# Patient Record
Sex: Female | Born: 1990 | Race: White | Hispanic: No | Marital: Married | Smoking: Never smoker
Health system: Southern US, Community
[De-identification: ages and names within clinical notes are randomized; demographics above are authoritative.]

## PROBLEM LIST (undated history)

## (undated) ENCOUNTER — Inpatient Hospital Stay (HOSPITAL_COMMUNITY): Payer: 59

## (undated) DIAGNOSIS — K219 Gastro-esophageal reflux disease without esophagitis: Secondary | ICD-10-CM

## (undated) DIAGNOSIS — T8859XA Other complications of anesthesia, initial encounter: Secondary | ICD-10-CM

## (undated) DIAGNOSIS — J45909 Unspecified asthma, uncomplicated: Secondary | ICD-10-CM

## (undated) DIAGNOSIS — I7774 Dissection of vertebral artery: Secondary | ICD-10-CM

## (undated) HISTORY — PX: TONSILLECTOMY: SUR1361

---

## 2016-02-04 ENCOUNTER — Encounter (HOSPITAL_BASED_OUTPATIENT_CLINIC_OR_DEPARTMENT_OTHER): Payer: Self-pay | Admitting: *Deleted

## 2016-02-04 ENCOUNTER — Emergency Department (HOSPITAL_BASED_OUTPATIENT_CLINIC_OR_DEPARTMENT_OTHER): Payer: 59

## 2016-02-04 ENCOUNTER — Observation Stay (HOSPITAL_BASED_OUTPATIENT_CLINIC_OR_DEPARTMENT_OTHER)
Admission: EM | Admit: 2016-02-04 | Discharge: 2016-02-05 | Disposition: A | Discharge status: Home / Self Care | Payer: 59 | Attending: Internal Medicine | Admitting: Internal Medicine

## 2016-02-04 DIAGNOSIS — Z6834 Body mass index (BMI) 34.0-34.9, adult: Secondary | ICD-10-CM | POA: Diagnosis not present

## 2016-02-04 DIAGNOSIS — R42 Dizziness and giddiness: Secondary | ICD-10-CM | POA: Diagnosis not present

## 2016-02-04 DIAGNOSIS — E669 Obesity, unspecified: Secondary | ICD-10-CM | POA: Diagnosis not present

## 2016-02-04 DIAGNOSIS — M542 Cervicalgia: Principal | ICD-10-CM | POA: Insufficient documentation

## 2016-02-04 DIAGNOSIS — I639 Cerebral infarction, unspecified: Secondary | ICD-10-CM

## 2016-02-04 DIAGNOSIS — H539 Unspecified visual disturbance: Secondary | ICD-10-CM

## 2016-02-04 DIAGNOSIS — J45909 Unspecified asthma, uncomplicated: Secondary | ICD-10-CM | POA: Diagnosis not present

## 2016-02-04 DIAGNOSIS — G43109 Migraine with aura, not intractable, without status migrainosus: Secondary | ICD-10-CM | POA: Insufficient documentation

## 2016-02-04 DIAGNOSIS — I7774 Dissection of vertebral artery: Secondary | ICD-10-CM | POA: Diagnosis present

## 2016-02-04 DIAGNOSIS — R51 Headache: Secondary | ICD-10-CM | POA: Diagnosis not present

## 2016-02-04 HISTORY — DX: Unspecified asthma, uncomplicated: J45.909

## 2016-02-04 LAB — PREGNANCY, URINE: Preg Test, Ur: NEGATIVE

## 2016-02-04 IMAGING — CT CT ANGIO NECK
2 of 7 series · 8 of 33 positions shown · IV contrast (APPLIED)
Comparison: None.

CLINICAL DATA: Acute onset LEFT posterior neck pain for 5 hours,
with blurry vision and dizziness.

EXAM:
CT HEAD WITHOUT CONTRAST
CT ANGIOGRAPHY OF THE NECK
TECHNIQUE: Contiguous axial images were obtained from the base of the skull
through the vertex without intravenous contrast. Multidetector CT
imaging of the neck was performed using the standard protocol during
bolus administration of intravenous contrast. Multiplanar CT image
reconstructions and MIPs were obtained to evaluate the vascular
anatomy. Carotid stenosis measurements (when applicable) are
obtained utilizing NASCET criteria, using the distal internal
carotid diameter as the denominator.
CONTRAST:  75mL OMNIPAQUE IOHEXOL 350 MG/ML SOLN

[Series 5: cta neck · axial · 0.37mm/px · z∈[-203,-121]mm · 2 of 125 slices shown]
[im 42/125  soft-tissue]
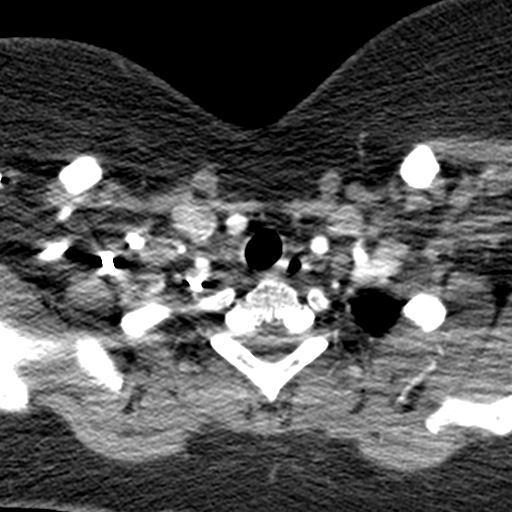
[im 83/125  soft-tissue]
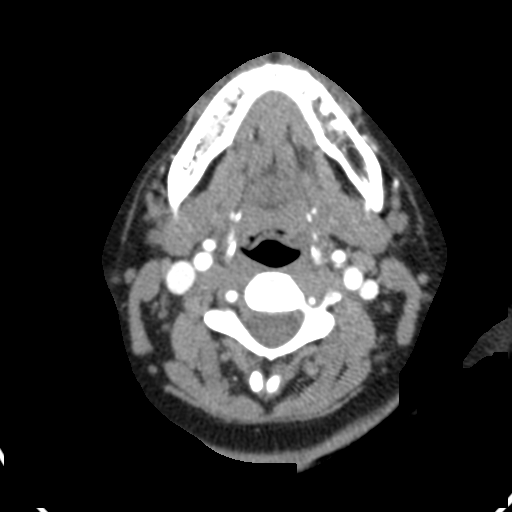

[Series 7: ax thin · axial · 0.39mm/px · z∈[-249,-74]mm · 6 of 247 slices shown]
[im 36/247  soft-tissue]
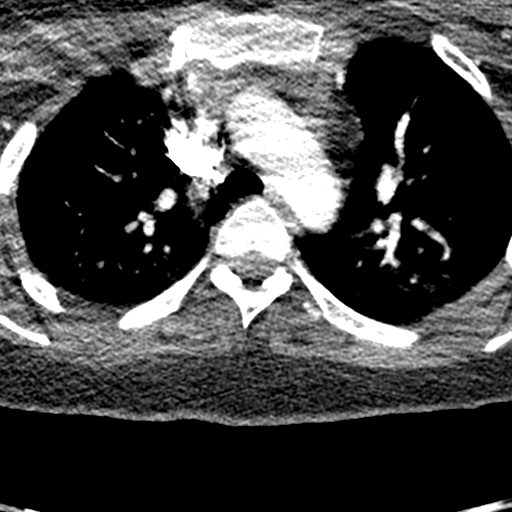
[im 71/247  bone]
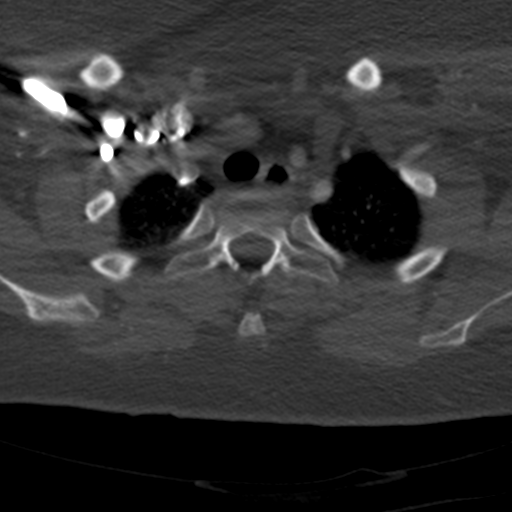
[im 106/247  soft-tissue]
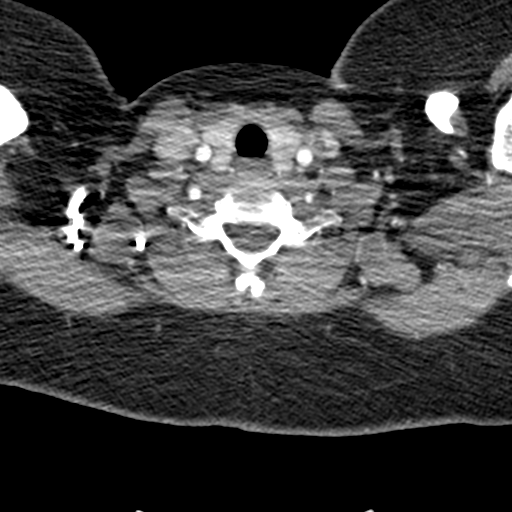
[im 141/247  bone]
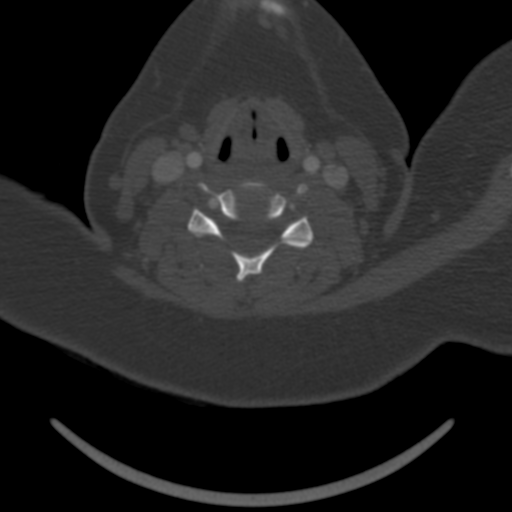
[im 176/247  soft-tissue]
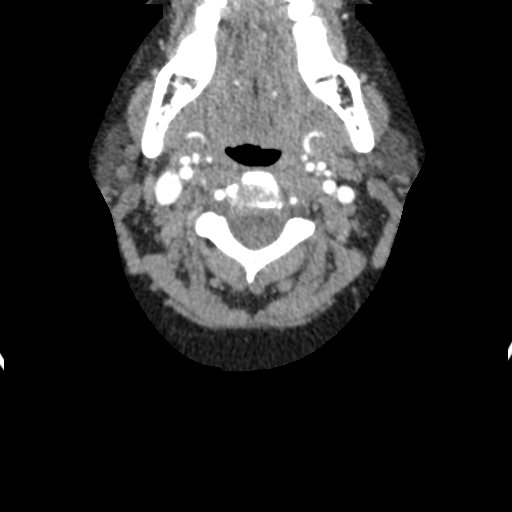
[im 211/247  bone]
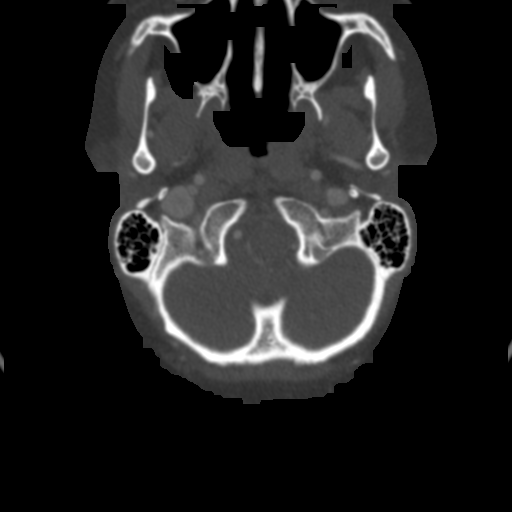

[8 of 33 positions shown; findings below may reference images not displayed]

FINDINGS: CT HEAD WITHOUT CONTRAST

The ventricles and sulci are normal. No intraparenchymal hemorrhage,
mass effect nor midline shift. No acute large vascular territory
infarcts.

No abnormal extra-axial fluid collections. Basal cisterns are
patent.

No skull fracture. The included ocular globes and orbital contents
are non-suspicious. The mastoid aircells and included paranasal
sinuses are well-aerated.

CT ANGIOGRAPHY OF THE NECK

Aortic arch: Normal appearance of the thoracic arch, normal branch
pattern. The origins of the innominate, left Common carotid artery
and subclavian artery are widely patent.

Right carotid system: Common carotid artery is widely patent,
coursing in a straight line fashion. Normal appearance of the
carotid bifurcation without hemodynamically significant stenosis by
NASCET criteria. Normal appearance of the included internal carotid
artery.

Left carotid system: Common carotid artery is widely patent,
coursing in a straight line fashion. Normal appearance of the
carotid bifurcation without hemodynamically significant stenosis by
NASCET criteria. Normal appearance of the included internal carotid
artery.

Vertebral arteries:RIGHT vertebral artery is dominant. Multifocal
contour abnormality of the LEFT V2, V3 and V4 segments with
intermittent midgrade stenosis.

Skeleton: No acute osseous process though bone windows have not been
submitted. Straightened cervical lordosis, normal appearance of the
cervical spine.

Other neck: Soft tissues of the neck are nonacute though, not
tailored for evaluation.
IMPRESSION: CT HEAD:  Normal.

CTA NECK: LEFT vertebral artery contour abnormality compatible with
dissection without dissection flap, no flow limiting stenosis.

Acute findings discussed with and reconfirmed by Dr.CESLOVA HUSIEN

## 2016-02-04 MED ORDER — MORPHINE SULFATE (PF) 4 MG/ML IV SOLN
4.0000 mg | Freq: Once | INTRAVENOUS | Status: AC
  Administered 2016-02-04: 4 mg via INTRAVENOUS
  Filled 2016-02-04: qty 1

## 2016-02-04 NOTE — ED Notes (Signed)
30 minutes ago she had neck pain sudden onset with blurred vision and she felt like she was going to pass out. No blurred vision on arrival but she has pain in her anterior neck.

## 2016-02-04 NOTE — ED Notes (Signed)
States while she was texting her vision became blurred.

## 2016-02-04 NOTE — ED Provider Notes (Signed)
CSN: [id number]     Arrival date & time 02/04/16  7829 History    By signing my name below, I, Arlan Organ, attest that this documentation has been prepared under the direction and in the presence of Doug Sou, MD.  Electronically Signed: Arlan Organ, ED Scribe. 02/04/2016. 10:08 PM.   Chief Complaint  Patient presents with  . Neck Pain   The history is provided by the patient. No language interpreter was used.    HPI Comments: Christine Wilkerson is a 25 y.o. female with a PMHx of asthma who presents to the Emergency Department complaining of gradual onset , ongoing, worsening posterior neck pain that radiates into the frontal area of her head onset 7:00 PM this evening. Discomfort is exacerbated when rotating her neck. No alleviating factors. Pt also reports an episode of blurred vision/"flashing vision" to the L eye while walking around a furniture store earlier. At that time, pt states she felt as though she could pass out. 2 additional episodes reported of blurred vision since time of onset. She is not a smoker. Denies any illicit drug use or alcohol consumption. Pt is unsure of LNMP- She recently had a baby in November of 2016. Currently breast-feeding No treatment prior to coming here  PCP: No PCP Per Patient    History reviewed. No pertinent past medical history. Past Surgical History  Procedure Laterality Date  . Cesarean section     No family history on file. Social History  Substance Use Topics  . Smoking status: Never Smoker   . Smokeless tobacco: None  . Alcohol Use: No   OB History    No data available     Review of Systems  Constitutional: Negative for fever and chills.  Eyes: Positive for visual disturbance.  Respiratory: Negative for cough.   Gastrointestinal: Negative for nausea and vomiting.  Genitourinary:       Amenorrhea  Musculoskeletal: Positive for neck pain.  Neurological: Positive for headaches.  Psychiatric/Behavioral: Negative for confusion.   All other systems reviewed and are negative.     Allergies  Review of patient's allergies indicates no known allergies.  Home Medications   Prior to Admission medications   Not on File   Triage Vitals: BP 115/67 mmHg  Pulse 66  Temp(Src) 97.8 F (36.6 C) (Oral)  Resp 18  Ht 5\' 5"  (1.651 m)  Wt 212 lb (96.163 kg)  BMI 35.28 kg/m2  SpO2 100%   Physical Exam  Constitutional: She is oriented to person, place, and time. She appears well-developed and well-nourished. No distress.  HENT:  Head: Normocephalic and atraumatic.  Eyes: Conjunctivae are normal. Pupils are equal, round, and reactive to light.  Neck: Neck supple. No tracheal deviation present. No thyromegaly present.  Cardiovascular: Normal rate and regular rhythm.   No murmur heard. Pulmonary/Chest: Effort normal and breath sounds normal.  Abdominal: Soft. Bowel sounds are normal. She exhibits no distension. There is no tenderness.  Musculoskeletal: Normal range of motion. She exhibits no edema or tenderness.  Neurological: She is alert and oriented to person, place, and time. No cranial nerve deficit. Coordination normal.  Gait normal Romberg normal pronator drift normal finger to nose normal DTRs symmetric bilaterally at knee jerk and ankle jerk and biceps toes downward going bilaterally  Skin: Skin is warm and dry. No rash noted.  Psychiatric: She has a normal mood and affect.  Nursing note and vitals reviewed.   ED Course  Procedures (including critical care time)  DIAGNOSTIC STUDIES: Oxygen  Saturation is 100% on RA, Normal by my interpretation.    COORDINATION OF CARE: 10:00 PM-Discussed treatment plan with pt at bedside and pt agreed to plan.     Labs Review Labs Reviewed - No data to display  Imaging Review No results found. I have personally reviewed and evaluated these images and lab results as part of my medical decision-making.   EKG Interpretation None     Radiologic findings discussed  with radiologist. 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 I consulted Dr. Amada Jupiter from neurology service who requests aspirin, and 23 hour observation for TIA evaluation undersurface of hospitalists. Results for orders placed or performed during the hospital encounter of 02/04/16  Pregnancy, urine  Result Value Ref Range   Preg Test, Ur NEGATIVE NEGATIVE   Ct Head Wo Contrast  02/05/2016  CLINICAL DATA:  Acute onset LEFT posterior neck pain for 5 hours, with blurry vision and dizziness. EXAM: CT HEAD WITHOUT CONTRAST CT ANGIOGRAPHY OF THE NECK TECHNIQUE: Contiguous axial images were obtained from the base of the skull through the vertex without intravenous contrast. Multidetector CT imaging of the neck was performed using the standard protocol during bolus administration of intravenous contrast. Multiplanar CT image reconstructions and MIPs were obtained to evaluate the vascular anatomy. Carotid stenosis measurements (when applicable) are obtained utilizing NASCET criteria, using the distal internal carotid diameter as the denominator. CONTRAST:  75mL OMNIPAQUE IOHEXOL 350 MG/ML SOLN COMPARISON:  None. FINDINGS: CT HEAD WITHOUT CONTRAST The ventricles and sulci are normal. No intraparenchymal hemorrhage, mass effect nor midline shift. No acute large vascular territory infarcts. No abnormal extra-axial fluid collections. Basal cisterns are patent. No skull fracture. The included ocular globes and orbital contents are non-suspicious. The mastoid aircells and included paranasal sinuses are well-aerated. CT ANGIOGRAPHY OF THE NECK Aortic arch: Normal appearance of the thoracic arch, normal branch pattern. The origins of the innominate, left Common carotid artery and subclavian artery are widely patent. Right carotid system: Common carotid artery is widely patent, coursing in a straight line fashion. Normal appearance of the carotid bifurcation without hemodynamically significant stenosis by NASCET  criteria. Normal appearance of the included internal carotid artery. Left carotid system: Common carotid artery is widely patent, coursing in a straight line fashion. Normal appearance of the carotid bifurcation without hemodynamically significant stenosis by NASCET criteria. Normal appearance of the included internal carotid artery. Vertebral arteries:RIGHT vertebral artery is dominant. Multifocal contour abnormality of the LEFT V2, V3 and V4 segments with intermittent midgrade stenosis. Skeleton: No acute osseous process though bone windows have not been submitted. Straightened cervical lordosis, normal appearance of the cervical spine. Other neck: Soft tissues of the neck are nonacute though, not tailored for evaluation. IMPRESSION: CT HEAD:  Normal. 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 Acute findings discussed with and reconfirmed by Rosalio Loud on 02/05/2016 at 12:40 am. Electronically Signed   By: Awilda Metro M.D.   On: 02/05/2016 00:41   Ct Angio Neck W/cm &/or Wo/cm  02/05/2016  CLINICAL DATA:  Acute onset LEFT posterior neck pain for 5 hours, with blurry vision and dizziness. EXAM: CT HEAD WITHOUT CONTRAST CT ANGIOGRAPHY OF THE NECK TECHNIQUE: Contiguous axial images were obtained from the base of the skull through the vertex without intravenous contrast. Multidetector CT imaging of the neck was performed using the standard protocol during bolus administration of intravenous contrast. Multiplanar CT image reconstructions and MIPs were obtained to evaluate the vascular anatomy. Carotid  stenosis measurements (when applicable) are obtained utilizing NASCET criteria, using the distal internal carotid diameter as the denominator. CONTRAST:  75mL OMNIPAQUE IOHEXOL 350 MG/ML SOLN COMPARISON:  None. FINDINGS: CT HEAD WITHOUT CONTRAST The ventricles and sulci are normal. No intraparenchymal hemorrhage, mass  effect nor midline shift. No acute large vascular territory infarcts. No abnormal extra-axial fluid collections. Basal cisterns are patent. No skull fracture. The included ocular globes and orbital contents are non-suspicious. The mastoid aircells and included paranasal sinuses are well-aerated. CT ANGIOGRAPHY OF THE NECK Aortic arch: Normal appearance of the thoracic arch, normal branch pattern. The origins of the innominate, left Common carotid artery and subclavian artery are widely patent. Right carotid system: Common carotid artery is widely patent, coursing in a straight line fashion. Normal appearance of the carotid bifurcation without hemodynamically significant stenosis by NASCET criteria. Normal appearance of the included internal carotid artery. Left carotid system: Common carotid artery is widely patent, coursing in a straight line fashion. Normal appearance of the carotid bifurcation without hemodynamically significant stenosis by NASCET criteria. Normal appearance of the included internal carotid artery. Vertebral arteries:RIGHT vertebral artery is dominant. Multifocal contour abnormality of the LEFT V2, V3 and V4 segments with intermittent midgrade stenosis. Skeleton: No acute osseous process though bone windows have not been submitted. Straightened cervical lordosis, normal appearance of the cervical spine. Other neck: Soft tissues of the neck are nonacute though, not tailored for evaluation. IMPRESSION: CT HEAD:  Normal. 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 Acute findings discussed with and reconfirmed by Rosalio Loud on 02/05/2016 at 12:40 am. Electronically Signed   By: Awilda Metro M.D.   On: 02/05/2016 00:41    MDM  Moderate clinical suspicion for vertebral artery dissection. Migraine headache also possibility. Case discussed with radiologist suggests CT angiogram of the neck and  noncontrasted head CT Final diagnoses:  None   Dr. Maryfrances Bunnell from hospitalist service was consulted and accepts patient in transfer. Patient passed stroke swallow screen.ASA orderd. Plan 23 hour observation to telemetry  Diagnosis acute vertebral artery dissection        Doug Sou, MD 02/05/16 [id number]

## 2016-02-05 ENCOUNTER — Observation Stay (HOSPITAL_COMMUNITY): Payer: 59

## 2016-02-05 ENCOUNTER — Encounter (HOSPITAL_COMMUNITY): Payer: Self-pay | Admitting: Family Medicine

## 2016-02-05 DIAGNOSIS — I7774 Dissection of vertebral artery: Secondary | ICD-10-CM | POA: Diagnosis present

## 2016-02-05 DIAGNOSIS — G43109 Migraine with aura, not intractable, without status migrainosus: Secondary | ICD-10-CM | POA: Insufficient documentation

## 2016-02-05 LAB — CBC
HEMATOCRIT: 40.7 % (ref 36.0–46.0)
HEMOGLOBIN: 12.9 g/dL (ref 12.0–15.0)
MCH: 27.5 pg (ref 26.0–34.0)
MCHC: 31.7 g/dL (ref 30.0–36.0)
MCV: 86.8 fL (ref 78.0–100.0)
Platelets: 273 10*3/uL (ref 150–400)
RBC: 4.69 MIL/uL (ref 3.87–5.11)
RDW: 13 % (ref 11.5–15.5)
WBC: 9.2 10*3/uL (ref 4.0–10.5)

## 2016-02-05 LAB — CBC WITH DIFFERENTIAL/PLATELET
Basophils Absolute: 0 10*3/uL (ref 0.0–0.1)
Basophils Relative: 0 %
EOS ABS: 0.1 10*3/uL (ref 0.0–0.7)
EOS PCT: 1 %
HCT: 43.1 % (ref 36.0–46.0)
Hemoglobin: 13.9 g/dL (ref 12.0–15.0)
LYMPHS ABS: 3.1 10*3/uL (ref 0.7–4.0)
Lymphocytes Relative: 30 %
MCH: 27.9 pg (ref 26.0–34.0)
MCHC: 32.3 g/dL (ref 30.0–36.0)
MCV: 86.4 fL (ref 78.0–100.0)
MONO ABS: 0.8 10*3/uL (ref 0.1–1.0)
Monocytes Relative: 7 %
Neutro Abs: 6.6 10*3/uL (ref 1.7–7.7)
Neutrophils Relative %: 62 %
PLATELETS: 277 10*3/uL (ref 150–400)
RBC: 4.99 MIL/uL (ref 3.87–5.11)
RDW: 13.4 % (ref 11.5–15.5)
WBC: 10.6 10*3/uL — AB (ref 4.0–10.5)

## 2016-02-05 LAB — BASIC METABOLIC PANEL
Anion gap: 12 (ref 5–15)
BUN: 14 mg/dL (ref 6–20)
CHLORIDE: 105 mmol/L (ref 101–111)
CO2: 25 mmol/L (ref 22–32)
CREATININE: 0.62 mg/dL (ref 0.44–1.00)
Calcium: 9.8 mg/dL (ref 8.9–10.3)
GFR calc Af Amer: >60 mL/min (ref >60)
GLUCOSE: 96 mg/dL (ref 65–99)
Potassium: 4 mmol/L (ref 3.5–5.1)
SODIUM: 142 mmol/L (ref 135–145)

## 2016-02-05 LAB — CREATININE, SERUM: CREATININE: 0.59 mg/dL (ref 0.44–1.00)

## 2016-02-05 IMAGING — MR MR HEAD W/O CM
9 of 12 series · 29 of 48 positions shown · non-contrast
Comparison: CTA neck and noncontrast head CT 02/04/2016.

CLINICAL DATA: 24-year-old female with acute onset left posterior
neck pain, blurred vision and dizziness. Irregularity of the non
dominant left vertebral artery on CTA highly suspicious for acute
dissection. Initial encounter.

EXAM:
MRI HEAD WITHOUT CONTRAST
MRA HEAD WITHOUT CONTRAST
TECHNIQUE: Multiplanar, multiecho pulse sequences of the brain and surrounding
structures were obtained without intravenous contrast. Angiographic
images of the head were obtained using MRA technique without
contrast.

[Series 3: DWI · axial · 3.6mm · 0.94mm/px · z∈[-159,-16]mm · 6 of 86 slices shown (1 of 4)]
[im 1/86]
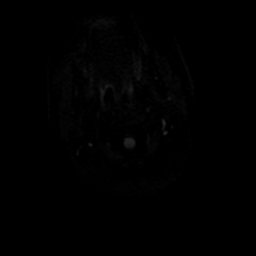
[im 18/86]
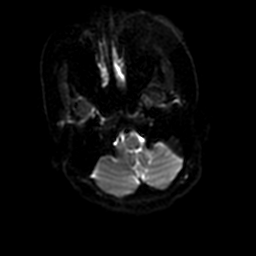
[im 35/86]
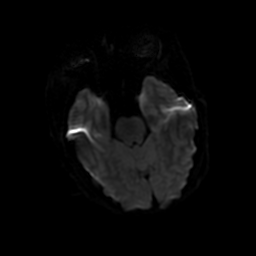
[im 52/86]
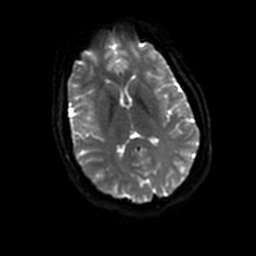
[im 69/86]
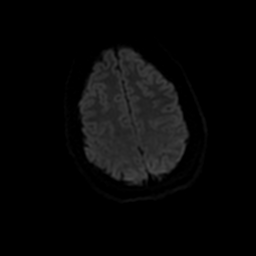
[im 86/86]
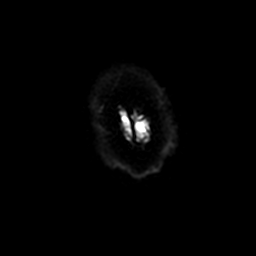

[Series 4: FLAIR · sagittal · 5.0mm · 0.47mm/px · 1 of 21 slices shown (1 of 2)]
[im 1/21]
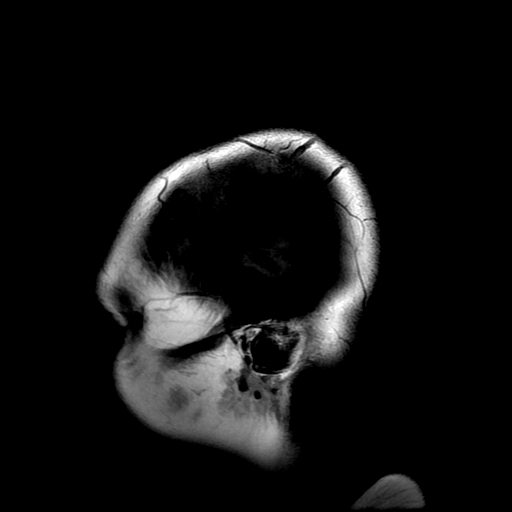

[Series 5: ax (id) 2 · axial · 1.2mm · 0.43mm/px · z∈[-116,-34]mm · 6 of 200 slices shown]
[im 1/200]
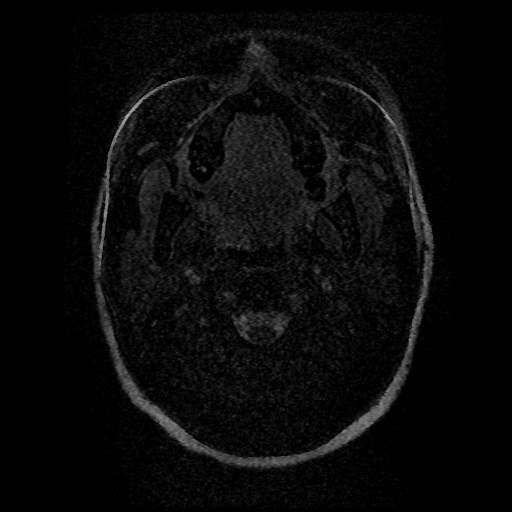
[im 31/200]
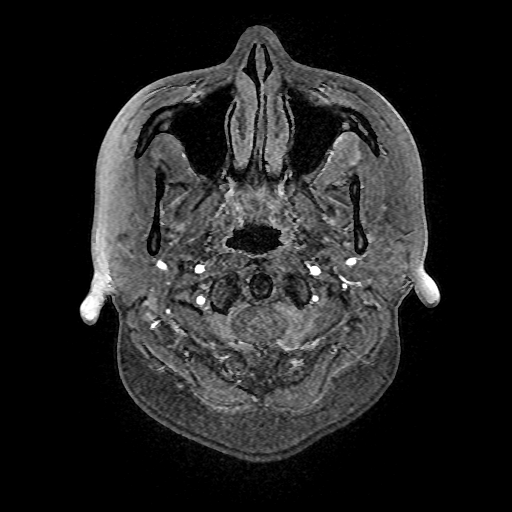
[im 62/200]
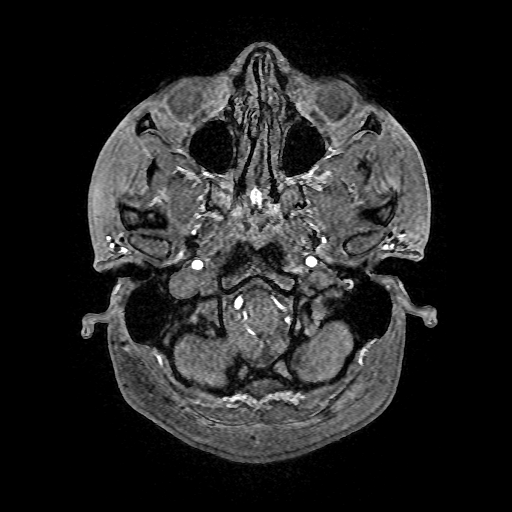
[im 92/200]
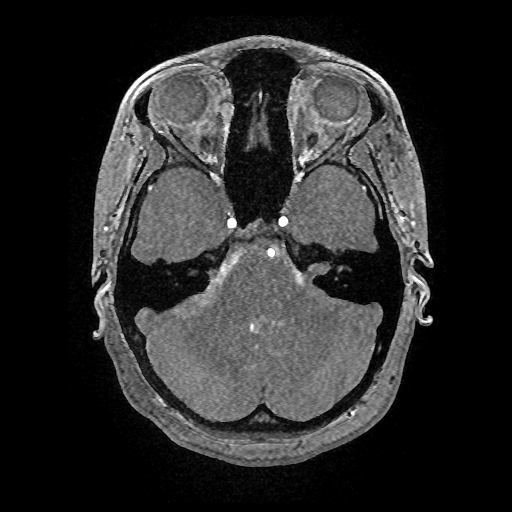
[im 108/200]
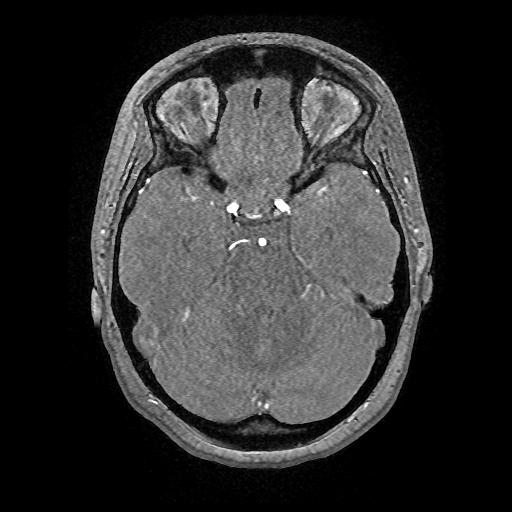
[im 138/200]
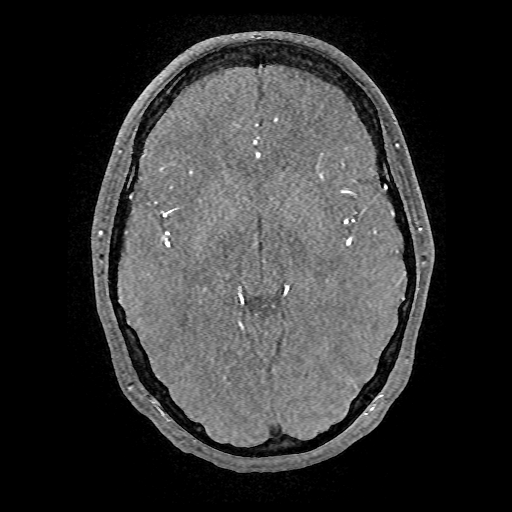

[Series 6: T2 · axial · 5.0mm · 0.47mm/px · z∈[-147,-17]mm · 2 of 24 slices shown (1 of 2)]
[im 1/24]
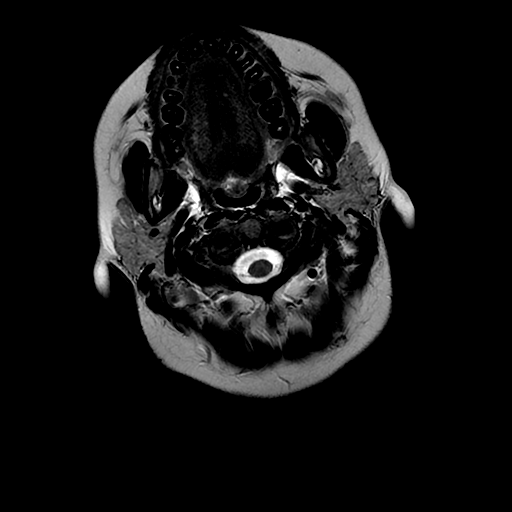
[im 24/24]
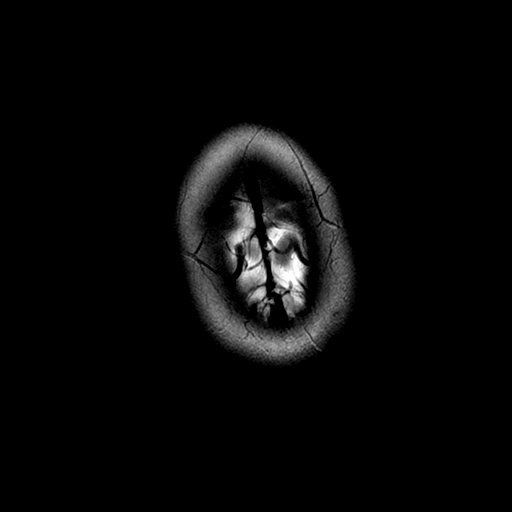

[Series 7: FLAIR · axial · 5.0mm · 0.47mm/px · z∈[-147,-17]mm · 2 of 24 slices shown (2 of 2)]
[im 1/24]
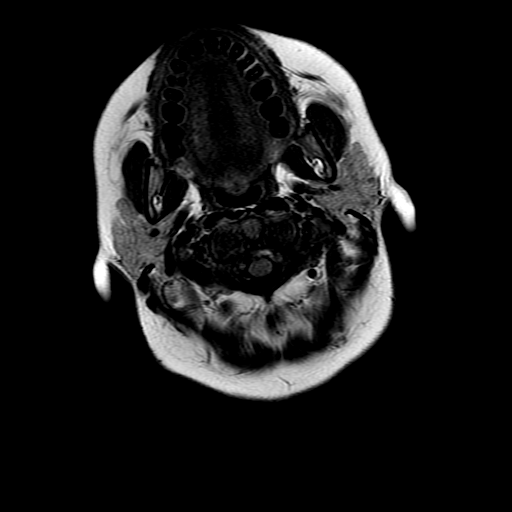
[im 24/24]
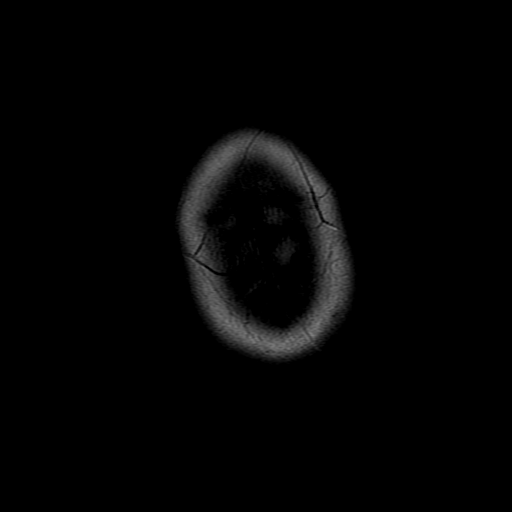

[Series 10: DWI · coronal · 5.0mm · 0.94mm/px · 5 of 72 slices shown (2 of 4)]
[im 1/72]
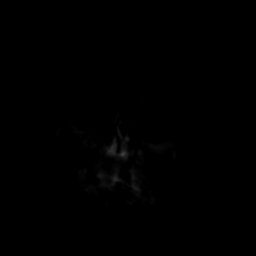
[im 18/72]
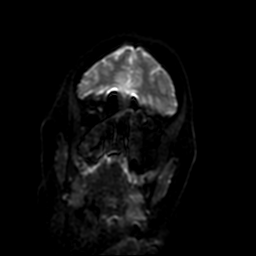
[im 36/72]
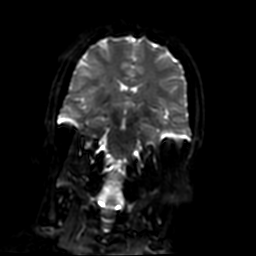
[im 54/72]
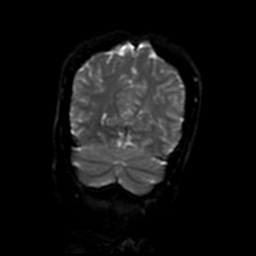
[im 72/72]
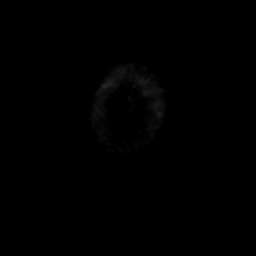

[Series 11: T2 · coronal · 5.0mm · 0.39mm/px · 2 of 30 slices shown (2 of 2)]
[im 1/30]
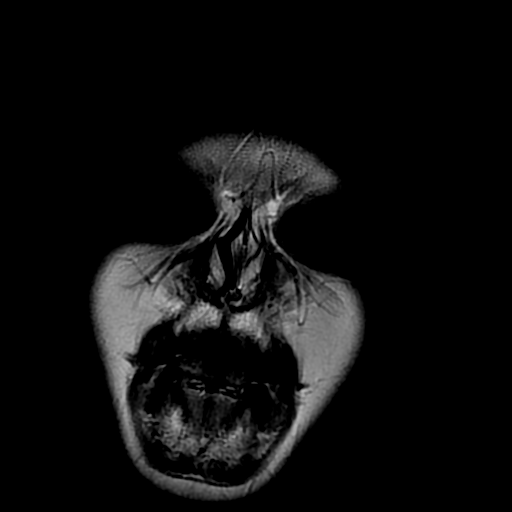
[im 30/30]
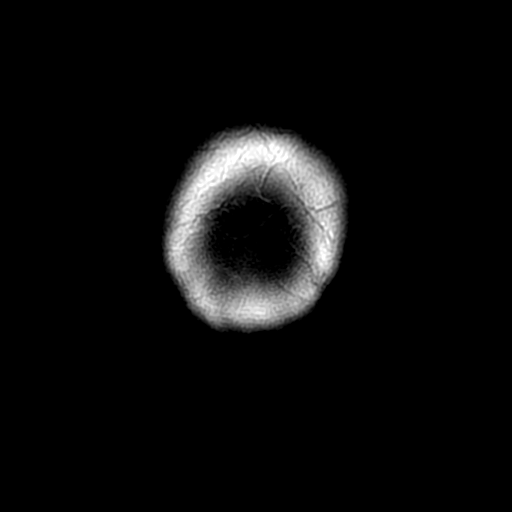

[Series 300: DWI · axial · 3.6mm · 0.94mm/px · z∈[-159,-16]mm · 3 of 43 slices shown (3 of 4)]
[im 1/43]
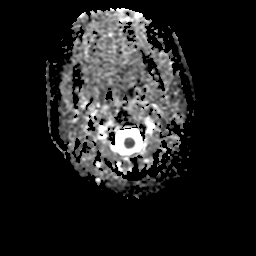
[im 22/43]
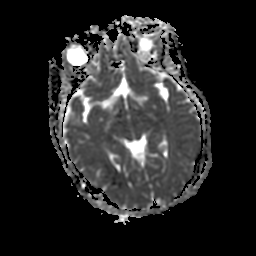
[im 43/43]
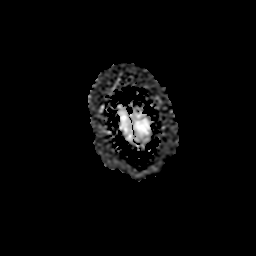

[Series 1000: DWI · coronal · 5.0mm · 0.94mm/px · 2 of 36 slices shown (4 of 4)]
[im 1/36]
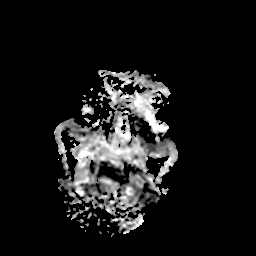
[im 36/36]
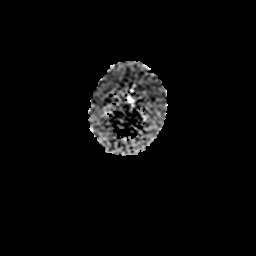

[29 of 48 positions shown; findings below may reference images not displayed]

FINDINGS: MRI HEAD FINDINGS

No restricted diffusion or evidence of acute infarction. No
posterior fossa or posterior circulation restricted diffusion
identified. Major intracranial vascular flow voids are within normal
limits.

Cerebral volume is normal. No midline shift, mass effect, evidence
of mass lesion, ventriculomegaly, extra-axial collection or acute
intracranial hemorrhage. Cervicomedullary junction and pituitary are
within normal limits. Negative visualized cervical spine. Gray and
white matter signal is within normal limits throughout the brain. No
encephalomalacia or chronic cerebral blood products identified.

Visible internal auditory structures appear normal. Mastoids are
clear. Negative nasopharynx. Paranasal sinuses are clear. Negative
orbit and scalp soft tissues. Visualized bone marrow signal is
within normal limits.

MRA HEAD FINDINGS

Antegrade flow in the non dominant visualized left vertebral artery
with no definite pathologic vessel contour abnormality in the V3 or
V4 segments. Dominant left AICA. Negative distal right vertebral
artery. Normal right PICA origin. Patent vertebrobasilar junction.
No basilar artery irregularity. Normal SCA and PCA origins. Right
posterior communicating artery is present, the left is diminutive or
absent. Bilateral PCA branches are within normal limits.

Antegrade flow in both ICA siphons. No siphon stenosis. A 1-2 mm
linear outpouching from the right cavernous ICA could be related to
a small cavernous branch infundibulum or cavernous sinus venous
artifact (series 5, image 101). Otherwise normal siphon contours.
Normal ophthalmic and right posterior communicating artery origins.
Normal carotid termini, MCA and ACA origins.

Anterior communicating artery and visualized bilateral ACA branches
are within normal limits. Visualized bilateral MCA vessels are
within normal limits.
IMPRESSION: 1. No acute infarct identified. Normal noncontrast MRI appearance of
the brain.
2. Negative intracranial MRA. Posterior circulation, including the
intracranial left vertebral artery, appears normal limits. See neck
CTA findings from 7710 hours today regarding abnormal cervical left
vertebral artery.

## 2016-02-05 IMAGING — CT CT HEAD W/O CM
1 series · 14 of 30 positions shown, 18 images · IV contrast (omnipaque)
Comparison: None.

CLINICAL DATA: Acute onset LEFT posterior neck pain for 5 hours,
with blurry vision and dizziness.

EXAM:
CT HEAD WITHOUT CONTRAST
CT ANGIOGRAPHY OF THE NECK
TECHNIQUE: Contiguous axial images were obtained from the base of the skull
through the vertex without intravenous contrast. Multidetector CT
imaging of the neck was performed using the standard protocol during
bolus administration of intravenous contrast. Multiplanar CT image
reconstructions and MIPs were obtained to evaluate the vascular
anatomy. Carotid stenosis measurements (when applicable) are
obtained utilizing NASCET criteria, using the distal internal
carotid diameter as the denominator.
CONTRAST:  75mL OMNIPAQUE IOHEXOL 350 MG/ML SOLN

[Series 2: head wo · axial · 0.47mm/px · z∈[-124,+10]mm · 14 of 30 slices shown, 18 images]
[im 3/30  brain]
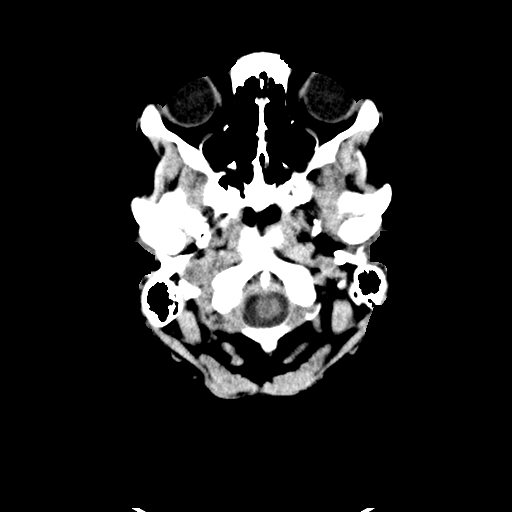
[im 3/30  bone]
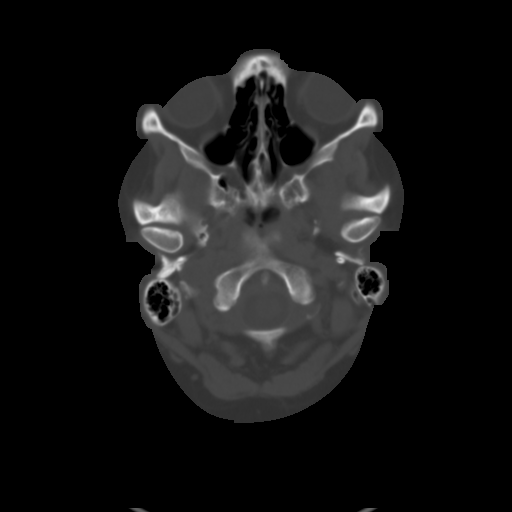
[im 5/30  brain]
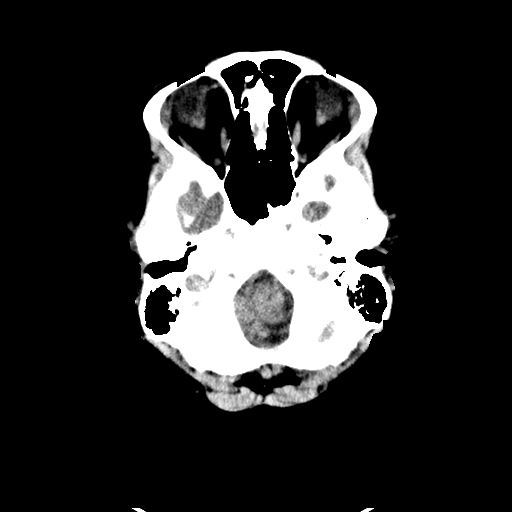
[im 7/30  brain]
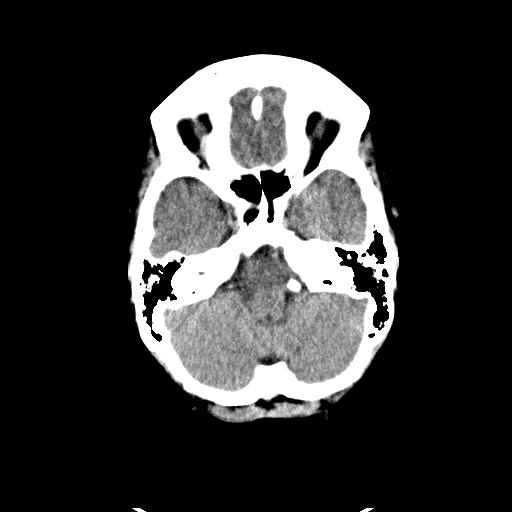
[im 9/30  brain]
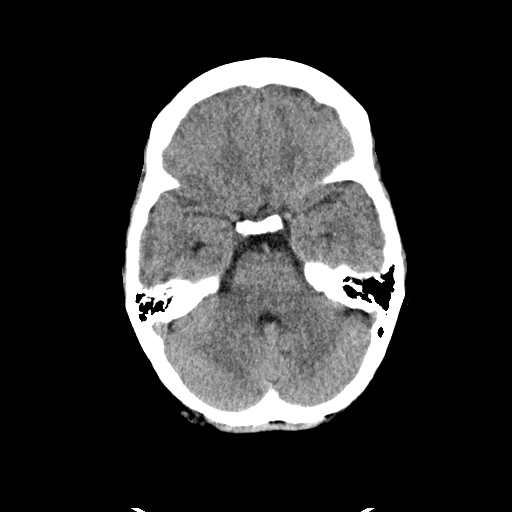
[im 11/30  brain]
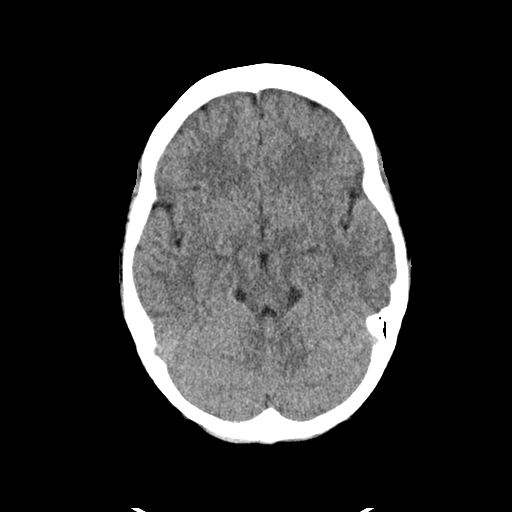
[im 11/30  bone]
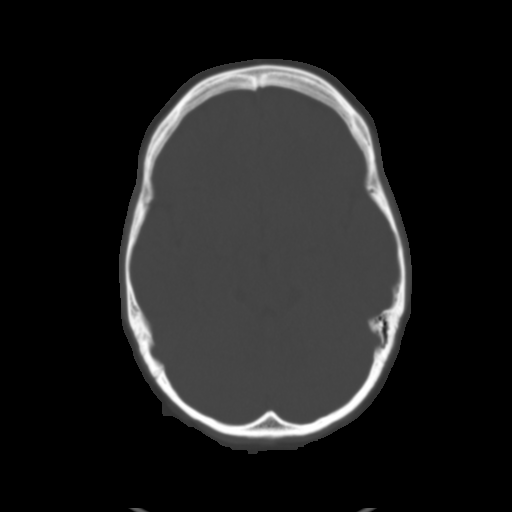
[im 13/30  brain]
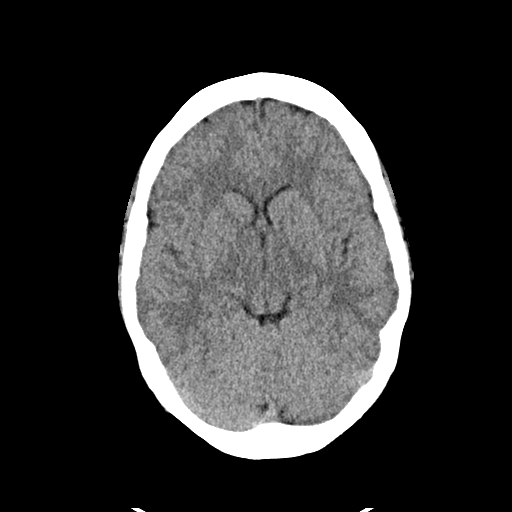
[im 15/30  brain]
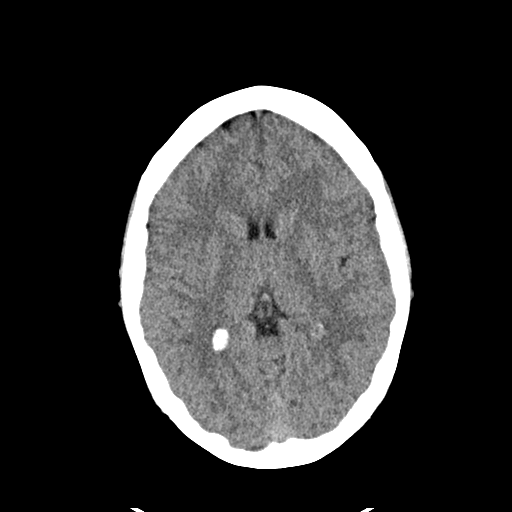
[im 17/30  brain]
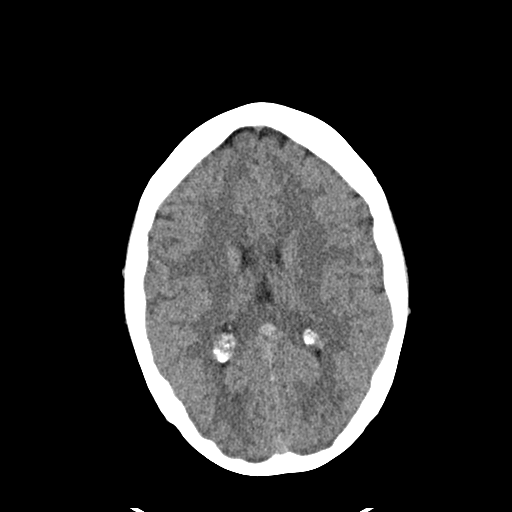
[im 19/30  brain]
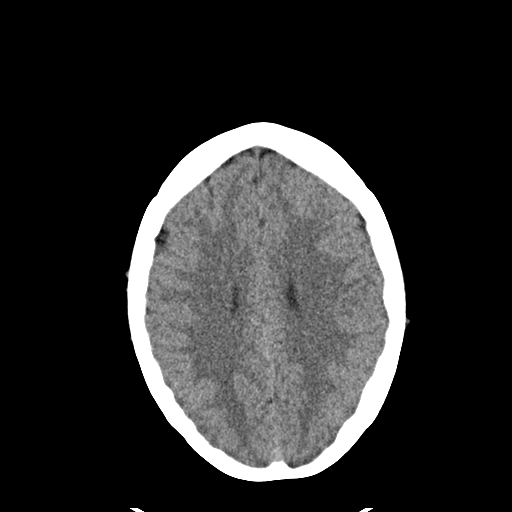
[im 19/30  bone]
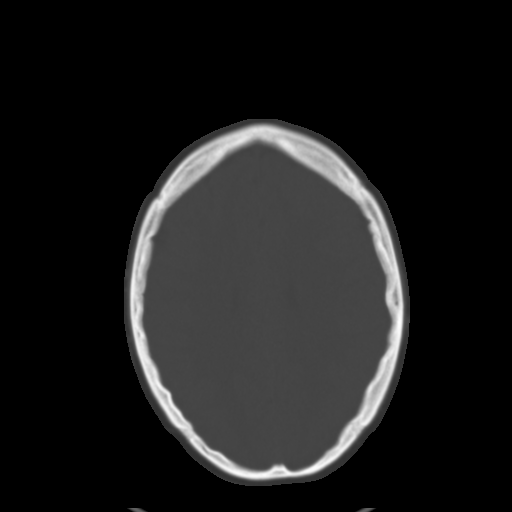
[im 21/30  brain]
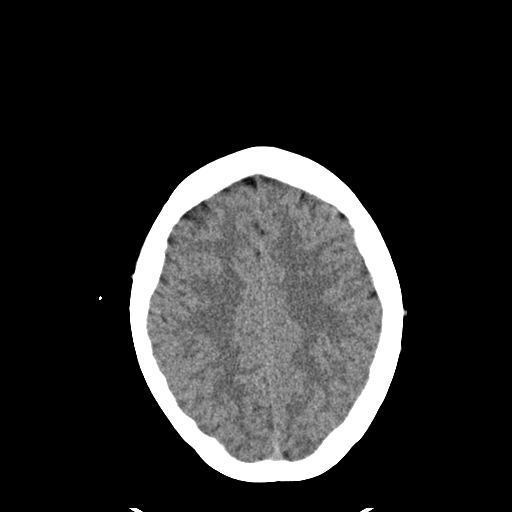
[im 23/30  brain]
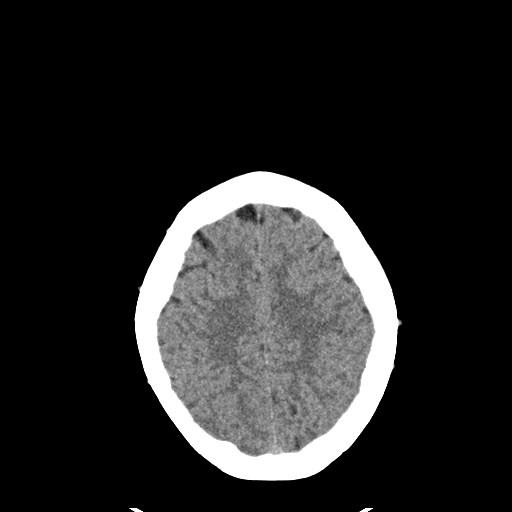
[im 25/30  brain]
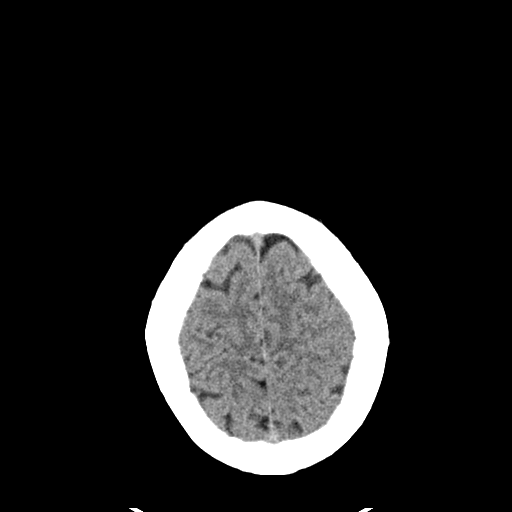
[im 27/30  brain]
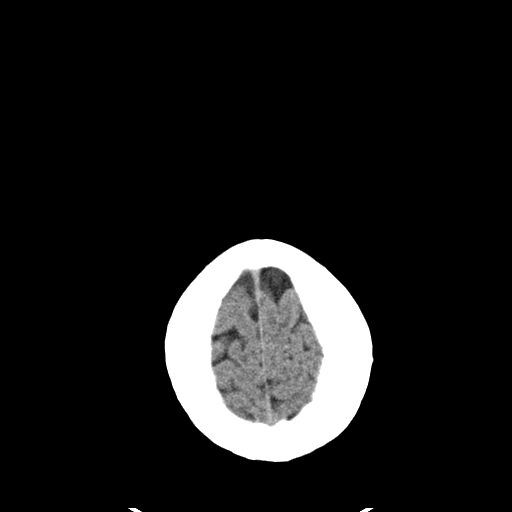
[im 27/30  bone]
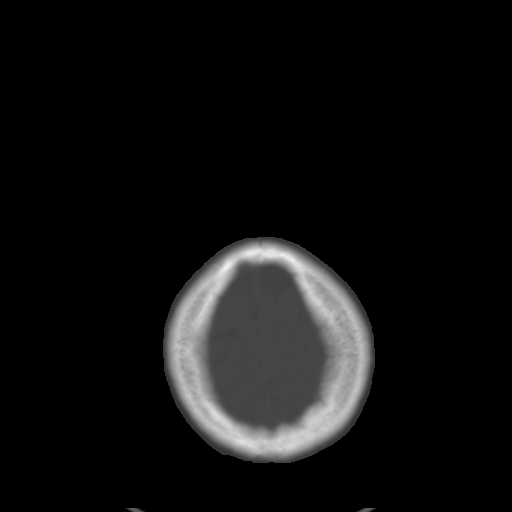
[im 29/30  brain]
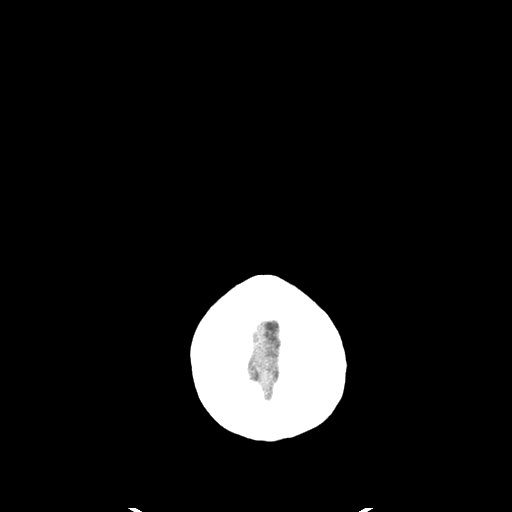

[14 of 30 positions shown; findings below may reference images not displayed]

FINDINGS: CT HEAD WITHOUT CONTRAST

The ventricles and sulci are normal. No intraparenchymal hemorrhage,
mass effect nor midline shift. No acute large vascular territory
infarcts.

No abnormal extra-axial fluid collections. Basal cisterns are
patent.

No skull fracture. The included ocular globes and orbital contents
are non-suspicious. The mastoid aircells and included paranasal
sinuses are well-aerated.

CT ANGIOGRAPHY OF THE NECK

Aortic arch: Normal appearance of the thoracic arch, normal branch
pattern. The origins of the innominate, left Common carotid artery
and subclavian artery are widely patent.

Right carotid system: Common carotid artery is widely patent,
coursing in a straight line fashion. Normal appearance of the
carotid bifurcation without hemodynamically significant stenosis by
NASCET criteria. Normal appearance of the included internal carotid
artery.

Left carotid system: Common carotid artery is widely patent,
coursing in a straight line fashion. Normal appearance of the
carotid bifurcation without hemodynamically significant stenosis by
NASCET criteria. Normal appearance of the included internal carotid
artery.

Vertebral arteries:RIGHT vertebral artery is dominant. Multifocal
contour abnormality of the LEFT V2, V3 and V4 segments with
intermittent midgrade stenosis.

Skeleton: No acute osseous process though bone windows have not been
submitted. Straightened cervical lordosis, normal appearance of the
cervical spine.

Other neck: Soft tissues of the neck are nonacute though, not
tailored for evaluation.
IMPRESSION: CT HEAD:  Normal.

CTA NECK: LEFT vertebral artery contour abnormality compatible with
dissection without dissection flap, no flow limiting stenosis.

Acute findings discussed with and reconfirmed by Dr.CESLOVA HUSIEN

## 2016-02-05 MED ORDER — ASPIRIN 325 MG PO TABS: 325.0000 mg | ORAL_TABLET | Freq: Once | ORAL | Status: DC

## 2016-02-05 MED ORDER — ASPIRIN 325 MG PO TABS: 325.0000 mg | ORAL_TABLET | Freq: Every day | ORAL | Status: DC

## 2016-02-05 MED ORDER — ASPIRIN 325 MG PO TABS
325.0000 mg | ORAL_TABLET | Freq: Every day | ORAL | Status: DC
  Administered 2016-02-05: 325 mg via ORAL
  Filled 2016-02-05: qty 1

## 2016-02-05 MED ORDER — ENOXAPARIN SODIUM 40 MG/0.4ML ~~LOC~~ SOLN
40.0000 mg | SUBCUTANEOUS | Status: DC
  Administered 2016-02-05: 40 mg via SUBCUTANEOUS
  Filled 2016-02-05: qty 0.4

## 2016-02-05 MED ORDER — OXYCODONE HCL 5 MG PO TABS: 5.0000 mg | ORAL_TABLET | Freq: Four times a day (QID) | ORAL | Status: DC | PRN

## 2016-02-05 MED ORDER — MORPHINE SULFATE (PF) 4 MG/ML IV SOLN
4.0000 mg | Freq: Once | INTRAVENOUS | Status: AC
  Administered 2016-02-05: 4 mg via INTRAVENOUS
  Filled 2016-02-05: qty 1

## 2016-02-05 MED ORDER — FENTANYL CITRATE (PF) 100 MCG/2ML IJ SOLN
100.0000 ug | Freq: Once | INTRAMUSCULAR | Status: AC
  Administered 2016-02-05: 100 ug via INTRAVENOUS
  Filled 2016-02-05: qty 2

## 2016-02-05 MED ORDER — IOHEXOL 350 MG/ML SOLN
75.0000 mL | Freq: Once | INTRAVENOUS | Status: AC | PRN
  Administered 2016-02-05: 75 mL via INTRAVENOUS

## 2016-02-05 MED ORDER — ACETAMINOPHEN 325 MG PO TABS: 650.0000 mg | ORAL_TABLET | ORAL | Status: DC | PRN

## 2016-02-05 MED ORDER — ASPIRIN 300 MG RE SUPP: 300.0000 mg | Freq: Every day | RECTAL | Status: DC

## 2016-02-05 MED ORDER — ASPIRIN 325 MG PO TABS
ORAL_TABLET | ORAL | Status: AC
Start: 2016-02-05 — End: 2016-02-05
  Administered 2016-02-05: 325 mg
  Filled 2016-02-05: qty 1

## 2016-02-05 MED ORDER — ACETAMINOPHEN 650 MG RE SUPP: 650.0000 mg | RECTAL | Status: DC | PRN

## 2016-02-05 NOTE — Progress Notes (Signed)
STROKE TEAM PROGRESS NOTE   HISTORY OF PRESENT ILLNESS Christine Wilkerson is a 25 y.o. female who was in her normal state of health until shortly before 7 PM tonight (LKW 645p 02/04/2016). At that point, she had sudden onset left neck pain which has been persistent. About 15 minutes later, she noticed visual change in her left eye which she describes as starting in the left lower quadrant then progressing to include most of the left side. Lasted for approximately 15 minutes. This is happened to subsequent times. There also flashes of light associated with these changes.She has had headache as well, this started later than the neck pain. She states that she did hit her head this morning, he is not sure if she jerked her neck or any other abnormal movements of her neck when she hit it. No other recent trauma, nausea, coughing. Premorbid modified rankin scale: 0. Patient was not administered TPA secondary to mild symptoms. She was admitted for further evaluation and treatment.   SUBJECTIVE (INTERVAL HISTORY) Her husband is at the bedside.  Overall she feels her condition is resolved. She recounted her HPI with Dr. Roda Shutters - reported circumferential blurring along the edge of her vision with flashing in the left eye only. Right eye no vision changes. Lasted 15 minutes. Recurred twice after getting to the ED. She then developed a HA. Denied hx of migraines. HA went away after getting medication. She had very infrequent HAs previously, maybe once every 2 months. She describes wavy lines in her visual field yesterday. Hit front of head hard yesterday on window seal.   OBJECTIVE Temp:  [97.4 F (36.3 C)-98.2 F (36.8 C)] 98.2 F (36.8 C) (02/15 0332) Pulse Rate:  [66-82] 78 (02/15 0332) Cardiac Rhythm:  [-] Normal sinus rhythm (02/15 0700) Resp:  [16-18] 18 (02/15 0332) BP: (100-129)/(61-76) 103/61 mmHg (02/15 0332) SpO2:  [97 %-100 %] 98 % (02/15 0332) Weight:  [94.983 kg (209 lb 6.4 oz)-96.163 kg (212 lb)] 94.983  kg (209 lb 6.4 oz) (02/15 0332)  CBC:   Recent Labs Lab 02/05/16 0120 02/05/16 0548  WBC 10.6* 9.2  NEUTROABS 6.6  --   HGB 13.9 12.9  HCT 43.1 40.7  MCV 86.4 86.8  PLT 277 273    Basic Metabolic Panel:   Recent Labs Lab 02/05/16 0120 02/05/16 0548  NA 142  --   K 4.0  --   CL 105  --   CO2 25  --   GLUCOSE 96  --   BUN 14  --   CREATININE 0.62 0.59  CALCIUM 9.8  --     Lipid Panel: No results found for: CHOL, TRIG, HDL, CHOLHDL, VLDL, LDLCALC HgbA1c: No results found for: HGBA1C Urine Drug Screen: No results found for: LABOPIA, COCAINSCRNUR, LABBENZ, AMPHETMU, THCU, LABBARB    IMAGING I have personally reviewed the radiological images below and agree with the radiology interpretations.  CT HEAD 02/05/2016  :  Normal.   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   Mr Brain Wo Contrast 02/05/2016  1. No acute infarct identified. Normal noncontrast MRI appearance of the brain.   Mr Maxine Glenn Head Wo Contrast 02/05/2016   2. Negative intracranial MRA. Posterior circulation, including the intracranial left vertebral artery, appears normal limits.    Physical exam  Temp:  [97.4 F (36.3 C)-98.2 F (36.8 C)] 97.7 F (36.5 C) (02/15 1200) Pulse Rate:  [61-82] 61 (02/15 1200)  Resp:  [16-19] 19 (02/15 1200) BP: (91-129)/(55-76) 91/55 mmHg (02/15 1200) SpO2:  [97 %-100 %] 99 % (02/15 1200) Weight:  [209 lb 6.4 oz (94.983 kg)-212 lb (96.163 kg)] 209 lb 6.4 oz (94.983 kg) (02/15 0332)  General - Well nourished, well developed, in no apparent distress.  Ophthalmologic - Sharp disc margins OU.   Cardiovascular - Regular rate and rhythm with no murmur.  Mental Status -  Level of arousal and orientation to time, place, and person were intact. Language including expression, naming, repetition, comprehension was assessed and found intact. Fund of Knowledge was assessed  and was intact.  Cranial Nerves II - XII - II - Visual field intact OU. III, IV, VI - Extraocular movements intact. V - Facial sensation intact bilaterally. VII - Facial movement intact bilaterally. VIII - Hearing & vestibular intact bilaterally. X - Palate elevates symmetrically. XI - Chin turning & shoulder shrug intact bilaterally. XII - Tongue protrusion intact.  Motor Strength - The patient's strength was normal in all extremities and pronator drift was absent.  Bulk was normal and fasciculations were absent.   Motor Tone - Muscle tone was assessed at the neck and appendages and was normal.  Reflexes - The patient's reflexes were 1+ in all extremities and she had no pathological reflexes.  Sensory - Light touch, temperature/pinprick, vibration and proprioception were assessed and were symmetrical.    Coordination - The patient had normal movements in the hands and feet with no ataxia or dysmetria.  Tremor was absent.  Gait and Station - deferred due to safety concerns.   ASSESSMENT/PLAN Christine Wilkerson is a 25 y.o. female with no significant medical history presenting with sudden onset neck pain with temporary visual change in her L eye. She did not receive IV t-PA due to mild symptoms.   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 Continue at discharge.  Patient counseled to be compliant with her antithrombotic medications  Ongoing aggressive stroke risk factor management  Therapy recommendations:  No therapy needs  Disposition:   Return home (lives with husband and 2 children in [place])  Questionable migraine with aura  Visual changes only happened in that eye  With flashing, maybe lines and scotoma  Headache preceded by visual changes  No clear history of migraine but there is a history of headache  Headache resolved  Other Stroke Risk Factors  Obesity, Body mass index is 34.85 kg/(m^2).   Patient lactating, aspirin okay - double check with pharmacy, this dosage is okay for lactating but recommend to avoid lactating within 3 hours of aspirin. I called patient cell phone and instructed her to avoid lactation within 3 hours of taking aspirin. She expressed understanding.   Hospital day # 1  Neurology will sign off. Please call with questions. Pt will follow up with Dr. Roda Shutters at [facility] in about 2 months. Thanks for the consult.  Marvel Plan, MD PhD Stroke Neurology 02/05/2016 6:12 PM     To contact Stroke Continuity provider, please refer to [email or web address]. After hours, contact General Neurology'

## 2016-02-05 NOTE — Discharge Summary (Signed)
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 female with no significant past medical history who presents with neck pain.  The patient was in her usual state of health until this evening when she had abrupt onset of left neck pain while she was walking around the store. The pain was severe in intensity and aching in character, and was soon associated with visual changes, flashing lights, and blurriness in her left eye as well as left-sided frontal headache. She felt dizzy but had no speech changes, no focal weakness, no numbness, no syncope or seizure, no vomiting, no fever, but presented immediately to the ER. There has been no recent significant head trauma.  In the ED, she was afebrile and normotensive. WBC 10.6, hemoglobin 13.9, Na 142, K4.0, creatinine 0.6. There was concern for vertebral artery dissection, and so CT angiogram of the neck was obtained which showed luminal irregularities in the left vertebral artery consistent with dissection.  The case was discussed with neurology, who recommended MRI and observation, and TRH were asked to admit.  Hospital Course:  Possible vertebral artery dissection;  neurological deficit has resolved.  Neurology recommends aspirin daily.  Follow up with neurology in 3 months.  I advised patient not to lactate if she is taking oxycodone, and or aspirin.    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  But due to MRI finding Vertebral artery dissection is possibility.  Headache and neuro deficit has resolved.   Procedures:  none  Consultations:  Neurology   Discharge Exam: Filed Vitals:   02/05/16 0332 02/05/16 1200  BP: 103/61 91/55  Pulse: 78 61  Temp: 98.2 F (36.8 C) 97.7 F (36.5 C)  Resp: 18 19    General: NAD Cardiovascular: S 1, S 2 RRR Respiratory: CTA  Discharge Instructions   Discharge Instructions    Diet - low sodium heart healthy    Complete by:  As directed      Increase activity slowly    Complete by:  As directed           Current Discharge Medication List    START taking these medications   Details  aspirin 325 MG tablet Take 1 tablet (325 mg total) by mouth daily. Qty: 30 tablet, Refills: 0    oxyCODONE (OXY IR/ROXICODONE) 5 MG immediate release tablet Take 1 tablet (5 mg total) by mouth every 6 (six) hours as needed for severe pain. Qty: 30 tablet, Refills: 0      CONTINUE these medications which have NOT CHANGED   Details  albuterol (PROVENTIL HFA;VENTOLIN HFA) 108 (90 Base) MCG/ACT inhaler Inhale 2 puffs into the lungs as needed. wheezing    Prenatal MV-Min-Fe Fum-FA-DHA (PRENATAL 1 PO) Take 1 tablet by mouth daily.       No Known Allergies Follow-up Information    Follow up with Xu,Jindong, MD In 4 weeks.   Specialty:  Neurology   Contact information:   [address] [place] [address] [phone]        The results of significant diagnostics from this hospitalization (including imaging, microbiology, ancillary and laboratory) are listed below for reference.    Significant Diagnostic Studies: Ct Head Wo Contrast  02/05/2016  CLINICAL DATA:  Acute onset LEFT posterior neck pain for 5 hours, with blurry vision and dizziness. EXAM: CT HEAD WITHOUT CONTRAST CT ANGIOGRAPHY OF THE NECK TECHNIQUE: Contiguous axial images were  obtained from the base of the skull through the vertex without intravenous contrast. Multidetector CT imaging of the neck was performed using the standard protocol during bolus administration of intravenous contrast. Multiplanar CT image reconstructions and MIPs were obtained to evaluate the vascular anatomy. Carotid stenosis measurements (when applicable) are obtained utilizing NASCET criteria, using the distal internal carotid diameter as the denominator. CONTRAST:  75mL OMNIPAQUE IOHEXOL 350 MG/ML SOLN COMPARISON:  None. FINDINGS: CT HEAD WITHOUT CONTRAST The ventricles and sulci are normal. No intraparenchymal hemorrhage, mass effect nor midline shift. No acute large vascular territory infarcts. No abnormal extra-axial fluid collections. Basal cisterns are patent. No skull fracture. The included ocular globes and orbital contents are non-suspicious. The mastoid aircells and included paranasal sinuses are well-aerated. CT ANGIOGRAPHY OF THE NECK Aortic arch: Normal appearance of the thoracic arch, normal branch pattern. The origins of the innominate, left Common carotid artery and subclavian artery are widely patent. Right carotid system: Common carotid artery is widely patent, coursing in a straight line fashion. Normal appearance of the carotid bifurcation without hemodynamically significant stenosis by NASCET criteria. Normal appearance of the included internal carotid artery. Left carotid system: Common carotid artery is widely patent, coursing in a straight line fashion. Normal appearance of the carotid bifurcation without hemodynamically significant stenosis by NASCET criteria. Normal appearance of the included internal carotid artery. Vertebral arteries:RIGHT vertebral artery is dominant. Multifocal contour abnormality of the LEFT V2, V3 and V4 segments with intermittent midgrade stenosis. Skeleton: No acute osseous process though bone windows have not been submitted. Straightened cervical lordosis, normal  appearance of the cervical spine. Other neck: Soft tissues of the neck are nonacute though, not tailored for evaluation. IMPRESSION: CT HEAD:  Normal. 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 Acute findings discussed with and reconfirmed by Rosalio Loud on 02/05/2016 at 12:40 am. Electronically Signed   By: Awilda Metro M.D.   On: 02/05/2016 00:41   Ct Angio Neck W/cm &/or Wo/cm  02/05/2016  CLINICAL DATA:  Acute onset LEFT posterior neck pain for 5 hours, with blurry vision and dizziness. EXAM: CT HEAD WITHOUT CONTRAST CT ANGIOGRAPHY OF THE NECK TECHNIQUE: Contiguous axial images were obtained from the base of the skull through the vertex without intravenous contrast. Multidetector CT imaging of the neck was performed using the standard protocol during bolus administration of intravenous contrast. Multiplanar CT image reconstructions and MIPs were obtained to evaluate the vascular anatomy. Carotid stenosis measurements (when applicable) are obtained utilizing NASCET criteria, using the distal internal carotid diameter as the denominator. CONTRAST:  75mL OMNIPAQUE IOHEXOL 350 MG/ML SOLN COMPARISON:  None. FINDINGS: CT HEAD WITHOUT CONTRAST The ventricles and sulci are normal. No intraparenchymal hemorrhage, mass effect nor midline shift. No acute large vascular territory infarcts. No abnormal extra-axial fluid collections. Basal cisterns are patent. No skull fracture. The included ocular globes and orbital contents are non-suspicious. The mastoid aircells and included paranasal sinuses are well-aerated. CT  ANGIOGRAPHY OF THE NECK Aortic arch: Normal appearance of the thoracic arch, normal branch pattern. The origins of the innominate, left Common carotid artery and subclavian artery are widely patent. Right carotid system: Common carotid artery is widely patent, coursing in a straight line fashion. Normal appearance  of the carotid bifurcation without hemodynamically significant stenosis by NASCET criteria. Normal appearance of the included internal carotid artery. Left carotid system: Common carotid artery is widely patent, coursing in a straight line fashion. Normal appearance of the carotid bifurcation without hemodynamically significant stenosis by NASCET criteria. Normal appearance of the included internal carotid artery. Vertebral arteries:RIGHT vertebral artery is dominant. Multifocal contour abnormality of the LEFT V2, V3 and V4 segments with intermittent midgrade stenosis. Skeleton: No acute osseous process though bone windows have not been submitted. Straightened cervical lordosis, normal appearance of the cervical spine. Other neck: Soft tissues of the neck are nonacute though, not tailored for evaluation. IMPRESSION: CT HEAD:  Normal. 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 Acute findings discussed with and reconfirmed by Rosalio Loud on 02/05/2016 at 12:40 am. Electronically Signed   By: Awilda Metro M.D.   On: 02/05/2016 00:41   Mr Shirlee Latch Wo Contrast  02/05/2016  CLINICAL DATA:  25 year old female with acute onset left posterior neck pain, blurred vision and dizziness. Irregularity of the non dominant left vertebral artery on CTA highly suspicious for acute dissection. Initial encounter. EXAM: MRI HEAD WITHOUT CONTRAST MRA HEAD WITHOUT CONTRAST TECHNIQUE: Multiplanar, multiecho pulse sequences of the brain and surrounding structures were obtained without intravenous contrast. Angiographic images of the head were obtained using MRA technique without contrast. COMPARISON:  CTA neck and noncontrast head CT 02/04/2016. FINDINGS: MRI HEAD FINDINGS No restricted diffusion or evidence of acute infarction. No posterior fossa or posterior circulation restricted diffusion identified. Major intracranial vascular flow voids  are within normal limits. Cerebral volume is normal. No midline shift, mass effect, evidence of mass lesion, ventriculomegaly, extra-axial collection or acute intracranial hemorrhage. Cervicomedullary junction and pituitary are within normal limits. Negative visualized cervical spine. Wallace Cullens and white matter signal is within normal limits throughout the brain. No encephalomalacia or chronic cerebral blood products identified. Visible internal auditory structures appear normal. Mastoids are clear. Negative nasopharynx. Paranasal sinuses are clear. Negative orbit and scalp soft tissues. Visualized bone marrow signal is within normal limits. MRA HEAD FINDINGS Antegrade flow in the non dominant visualized left vertebral artery with no definite pathologic vessel contour abnormality in the V3 or V4 segments. Dominant left AICA. Negative distal right vertebral artery. Normal right PICA origin. Patent vertebrobasilar junction. No basilar artery irregularity. Normal SCA and PCA origins. Right posterior communicating artery is present, the left is diminutive or absent. Bilateral PCA branches are within normal limits. Antegrade flow in both ICA siphons. No siphon stenosis. A 1-2 mm linear outpouching from the right cavernous ICA could be related to a small cavernous branch infundibulum or cavernous sinus venous artifact (series 5, image 101). Otherwise normal siphon contours. Normal ophthalmic and right posterior communicating artery origins. Normal carotid termini, MCA and ACA origins. Anterior communicating artery and visualized bilateral ACA branches are within normal limits. Visualized bilateral MCA vessels are within normal limits. IMPRESSION: 1. No acute infarct identified. Normal noncontrast MRI appearance of the brain. 2. Negative intracranial MRA. Posterior circulation, including the intracranial left vertebral artery, appears normal limits. See neck CTA findings from 0041 hours today regarding abnormal cervical left  vertebral artery. Electronically Signed  By: Odessa Fleming M.D.   On: 02/05/2016 10:30   Mr Brain Wo Contrast  02/05/2016  CLINICAL DATA:  25 year old female with acute onset left posterior neck pain, blurred vision and dizziness. Irregularity of the non dominant left vertebral artery on CTA highly suspicious for acute dissection. Initial encounter. EXAM: MRI HEAD WITHOUT CONTRAST MRA HEAD WITHOUT CONTRAST TECHNIQUE: Multiplanar, multiecho pulse sequences of the brain and surrounding structures were obtained without intravenous contrast. Angiographic images of the head were obtained using MRA technique without contrast. COMPARISON:  CTA neck and noncontrast head CT 02/04/2016. FINDINGS: MRI HEAD FINDINGS No restricted diffusion or evidence of acute infarction. No posterior fossa or posterior circulation restricted diffusion identified. Major intracranial vascular flow voids are within normal limits. Cerebral volume is normal. No midline shift, mass effect, evidence of mass lesion, ventriculomegaly, extra-axial collection or acute intracranial hemorrhage. Cervicomedullary junction and pituitary are within normal limits. Negative visualized cervical spine. Wallace Cullens and white matter signal is within normal limits throughout the brain. No encephalomalacia or chronic cerebral blood products identified. Visible internal auditory structures appear normal. Mastoids are clear. Negative nasopharynx. Paranasal sinuses are clear. Negative orbit and scalp soft tissues. Visualized bone marrow signal is within normal limits. MRA HEAD FINDINGS Antegrade flow in the non dominant visualized left vertebral artery with no definite pathologic vessel contour abnormality in the V3 or V4 segments. Dominant left AICA. Negative distal right vertebral artery. Normal right PICA origin. Patent vertebrobasilar junction. No basilar artery irregularity. Normal SCA and PCA origins. Right posterior communicating artery is present, the left is diminutive  or absent. Bilateral PCA branches are within normal limits. Antegrade flow in both ICA siphons. No siphon stenosis. A 1-2 mm linear outpouching from the right cavernous ICA could be related to a small cavernous branch infundibulum or cavernous sinus venous artifact (series 5, image 101). Otherwise normal siphon contours. Normal ophthalmic and right posterior communicating artery origins. Normal carotid termini, MCA and ACA origins. Anterior communicating artery and visualized bilateral ACA branches are within normal limits. Visualized bilateral MCA vessels are within normal limits. IMPRESSION: 1. No acute infarct identified. Normal noncontrast MRI appearance of the brain. 2. Negative intracranial MRA. Posterior circulation, including the intracranial left vertebral artery, appears normal limits. See neck CTA findings from 0041 hours today regarding abnormal cervical left vertebral artery. Electronically Signed   By: Odessa Fleming M.D.   On: 02/05/2016 10:30    Microbiology: No results found for this or any previous visit (from the past 240 hour(s)).   Labs: Basic Metabolic Panel:  Recent Labs Lab 02/05/16 0120 02/05/16 0548  NA 142  --   K 4.0  --   CL 105  --   CO2 25  --   GLUCOSE 96  --   BUN 14  --   CREATININE 0.62 0.59  CALCIUM 9.8  --    Liver Function Tests: No results for input(s): AST, ALT, ALKPHOS, BILITOT, PROT, ALBUMIN in the last 168 hours. No results for input(s): LIPASE, AMYLASE in the last 168 hours. No results for input(s): AMMONIA in the last 168 hours. CBC:  Recent Labs Lab 02/05/16 0120 02/05/16 0548  WBC 10.6* 9.2  NEUTROABS 6.6  --   HGB 13.9 12.9  HCT 43.1 40.7  MCV 86.4 86.8  PLT 277 273   Cardiac Enzymes: No results for input(s): CKTOTAL, CKMB, CKMBINDEX, TROPONINI in the last 168 hours. BNP: BNP (last 3 results) No results for input(s): BNP in the last 8760 hours.  ProBNP (last  3 results) No results for input(s): PROBNP in the last 8760  hours.  CBG: No results for input(s): GLUCAP in the last 168 hours.     Signed:  Alba Cory MD.  [facility] 02/05/2016, 1:38 PM

## 2016-02-05 NOTE — Discharge Instructions (Signed)
Do not breastfeed while taking aspirin and or oxycodone. Follow up with PCP

## 2016-02-05 NOTE — H&P (Signed)
History and Physical  Patient Name: Christine Wilkerson     OZH:[id number]    DOB: June 09, 1991    DOA: 02/04/2016 Referring physician: Doug Sou, MD PCP: No PCP Per Patient      Chief Complaint: Neck pain  HPI: Christine Wilkerson is a 25 y.o. female with no significant past medical history who presents with neck pain.  The patient was in her usual state of health until this evening when she had abrupt onset of left neck pain while she was walking around the store. The pain was severe in intensity and aching in character, and was soon associated with visual changes, flashing lights, and blurriness in her left eye as well as left-sided frontal headache. She felt dizzy but had no speech changes, no focal weakness, no numbness, no syncope or seizure, no vomiting, no fever, but presented immediately to the ER. There has been no recent significant head trauma.  In the ED, she was afebrile and normotensive. WBC 10.6, hemoglobin 13.9, Na 142, K4.0, creatinine 0.6.  There was concern for vertebral artery dissection, and so CT angiogram of the neck was obtained which showed luminal irregularities in the left vertebral artery consistent with dissection.  The case was discussed with neurology, who recommended MRI and observation, and [facility] were asked to admit.       Review of Systems:  Pt complains of neck pain, headache, visual changes. All other systems negative except as just noted or noted in the history of present illness.  No Known Allergies  Prior to Admission medications   Albuterol, when necessary     Past Medical History  Diagnosis Date  . Asthma     Past Surgical History  Procedure Laterality Date  . Cesarean section    . Cesarean section      Family history: family history includes Bell's palsy in her mother; Diabetes in her father; Neuropathy in her sister; Rheum arthritis in her mother. no family history of vascular disease or stroke  Social History: Patient lives with her husband  and 2 children in [place]. She works at home. She is not a smoker. She is independent with all ADLs and IADLs.       Physical Exam: BP 103/61 mmHg  Pulse 78  Temp(Src) 98.2 F (36.8 C) (Oral)  Resp 18  Ht  (1.651 m)  Wt 94.983 kg (209 lb 6.4 oz)  BMI 34.85 kg/m2  SpO2 98% General appearance: Well-developed, adult female, alert and in no acute distress.   Eyes: Anicteric, conjunctiva pink, lids and lashes normal.     ENT: No nasal deformity, discharge, or epistaxis.  OP moist without lesions.   Lymph: No cervical or supraclavicular lymphadenopathy. Skin: Warm and dry.  No jaundice.  No suspicious rashes or lesions. Cardiac: RRR, nl S1-S2, no murmurs appreciated.  Capillary refill is brisk.  JVP normal.  No LE edema.  Radial and DP pulses 2+ and symmetric. Respiratory: Normal respiratory rate and rhythm.  CTAB without rales or wheezes. Abdomen: Abdomen soft without rigidity.  No TTP. No ascites, distension.   MSK: No deformities or effusions. Neuro: Visual fields normal to confrontation.  Pupils are 3 mm and reactive to 2 mm.  Extraocular movements are intact, without nystagmus.  Cranial nerve 5 is within normal limits.  Cranial nerve 7 is symmetrical.  Cranial nerve 8 is within normal limits.  Cranial nerves 9 and 10 reveal equal palate elevation.  Cranial nerve 11 reveals sternocleidomastoid strong.  Cranial nerve 12 is midline.  Motor strength testing is 5/5 in the upper and lower extremities bilaterally with normal motor, tone and bulk.  Sensory examination is intact to light touch and position.  Romberg maneuver is negative for pathology.  Finger-to-nose testing is within normal limits. The patient is oriented to time, place and person.  Speech is fluent.  Naming is grossly intact.  Recall, recent and remote, as well as general fund of knowledge seem within normal limits.  Attention span and concentration are within normal limits. Psych: Behavior appropriate.  Affect normal.  No  evidence of aural or visual hallucinations or delusions.       Labs on Admission:  The metabolic panel shows normal sodium, potassium, bicarbonate, and renal function. The complete blood count shows minimal elevation in WBC, no anemia or thrombocytopenia. Pregnancy test negative.   Radiological Exams on Admission: Ct Head Wo Contrast and Ct Angio Neck W/cm &/or Wo/cm 02/05/2016  Vertebral arteries:RIGHT vertebral artery is dominant. Multifocal contour abnormality of the LEFT V2, V3 and V4 segments with intermittent midgrade stenosis.  IMPRESSION: CT HEAD:  Normal. 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       Assessment/Plan 1. Vertebral artery dissection:    -Aspirin 325 mg  -MRI brain now -Oxycodone or acetaminophen for pain, PRN -Consult to Neurology, appreciate cares       DVT PPx: Lovenox Diet: Regular Consultants: Neurology Code Status: Full Family Communication: Husband, present at bedside  Medical decision making: What exists of the patient's previous chart was reviewed in depth and the case was discussed with Dr. Ethelda Chick. Patient seen 5:23 AM on 02/05/2016.  Disposition Plan:  I recommend admission to telemetry.  Clinical condition: stable.  Anticipate MRI today, if normal, home after, otherwise, stroke work up per Neurology.      Alberteen Sam [facility] Pager [phone]

## 2016-02-05 NOTE — Progress Notes (Signed)
25 yo F previously healthy, had acute onset neck pain tonight, associated with transient (~2-4 hours) vision loss in the L eye and frontal headache.  There was concern in the ER for dissection, and a CT angiogram of the neck appears to show a vertebral artery dissection.  This was discussed with Neurology, who recommend aspirin, MRI tonight and TIA workup.  They will see.  To tele, obs status.

## 2016-02-05 NOTE — Care Management Note (Signed)
Case Management Note Donn Pierini RN, BSN Unit 2W-Case Manager [phone]  Patient Details  Name: Christine Wilkerson MRN: [id number] Date of Birth: 04-26-1991  Subjective/Objective:    Pt admitted with vertebral artery dissection                Action/Plan: PTA pt lived at home- anticipate return home- referral received for PCP needs- spoke with pt at bedside who states she is looking for PCP- [facility] # given to pt with instructions for use- also advised pt she could contact [facility] # on back of insurance card for list of providers- pt to f/u on finding PCP in area of choice.   Expected Discharge Date:    02/05/16              Expected Discharge Plan:  Home/Self Care  In-House Referral:     Discharge planning Services  CM Consult  Post Acute Care Choice:    Choice offered to:     DME Arranged:    DME Agency:     HH Arranged:    HH Agency:     Status of Service:  Completed, signed off  Medicare Important Message Given:    Date Medicare IM Given:    Medicare IM give by:    Date Additional Medicare IM Given:    Additional Medicare Important Message give by:     If discussed at Long Length of Stay Meetings, dates discussed:    Discharge Disposition: home/self care   Additional Comments:  Darrold Span, RN 02/05/2016, 2:50 PM

## 2016-02-05 NOTE — Consult Note (Signed)
Neurology Consultation Reason for Consult: Concern for dissection Referring Physician: Alden Server  CC: Visual change  History is obtained from: Patient  HPI: Christine Wilkerson is a 25 y.o. female who was in her normal state of health until shortly before 7 PM tonight. At that point, she had sudden onset left neck pain which has been persistent. About 15 minutes later, she noticed visual change in her left eye which she describes as starting in the left lower quadrant then progressing to include most of the left side. Lasted for approximately 15 minutes. This is happened to subsequent times. There also flashes of light associated with these changes.  She has had headache as well, this started later than the neck pain. She states that she did hit her head this morning, he is not sure if she jerked her neck or any other abnormal movements of her neck when she hit it. No other recent trauma, nausea, coughing.  LKW: 6:45 PM tpa given?: no, mild symptoms Premorbid modified rankin scale: 0   ROS: A 14 point ROS was performed and is negative except as noted in the HPI.   History reviewed. No pertinent past medical history.  family history: No history of  similar  Social History:  reports that she has never smoked. She does not have any smokeless tobacco history on file. She reports that she does not drink alcohol or use illicit drugs.   Exam: Current vital signs: BP 103/61 mmHg  Pulse 78  Temp(Src) 98.2 F (36.8 C) (Oral)  Resp 18  Ht  (1.651 m)  Wt 94.983 kg (209 lb 6.4 oz)  BMI 34.85 kg/m2  SpO2 98% Vital signs in last 24 hours: Temp:  [97.4 F (36.3 C)-98.2 F (36.8 C)] 98.2 F (36.8 C) (02/15 0332) Pulse Rate:  [66-82] 78 (02/15 0332) Resp:  [16-18] 18 (02/15 0332) BP: (100-129)/(61-76) 103/61 mmHg (02/15 0332) SpO2:  [97 %-100 %] 98 % (02/15 0332) Weight:  [94.983 kg (209 lb 6.4 oz)-96.163 kg (212 lb)] 94.983 kg (209 lb 6.4 oz) (02/15 0332)   Physical Exam   Constitutional: Appears well-developed and well-nourished.  Psych: Affect appropriate to situation Eyes: No scleral injection HENT: No OP obstrucion Head: Normocephalic.  Cardiovascular: Normal rate and regular rhythm.  Respiratory: Effort normal and breath sounds normal to anterior ascultation GI: Soft.  No distension. There is no tenderness.  Skin: WDI  Neuro: Mental Status: Patient is awake, alert, oriented to person, place, month, year, and situation. Patient is able to give a clear and coherent history. No signs of aphasia or neglect Cranial Nerves: II: Visual Fields are full. Pupils are equal, round, and reactive to light.   III,IV, VI: EOMI without ptosis or diploplia.  V: Facial sensation is symmetric to temperature VII: Facial movement is symmetric.  VIII: hearing is intact to voice X: Uvula elevates symmetrically XI: Shoulder shrug is symmetric. XII: tongue is midline without atrophy or fasciculations.  Motor: Tone is normal. Bulk is normal. 5/5 strength was present in all four extremities.  Sensory: Sensation is symmetric to light touch and temperature in the arms and legs. Deep Tendon Reflexes: 2+ and symmetric in the biceps and patellae.  Plantars: Toes are downgoing bilaterally.  Cerebellar: FNF and HKS are intact bilaterally  I have reviewed labs in epic and the results pertinent to this consultation are: BMP-unremarkable  I have reviewed the images obtained: CTA-irregularity in the left vertebral  Impression: 25 year old female with sudden onset neck pain and visual change with  findings on CT angiogram concerning for possible vertebral dissection. She does have pain in the location that would correspond to this and therefore I do think that this likely represents dissection. The visual changes, however, sound more migrainous in nature then ischemic given the progressive involvement of the field as well as flashing lights. I do think that she needs an MRI to  fully assess whether she has had actual areas of infarct were not.  If the MRI is negative, I do not think that further workup at this time would be likely to assist with lowering her stroke risk and therefore would not pursue any further workup.  Recommendations: 1) MRI brain 2) ASA 325 mg daily 3) if MRI does not demonstrate ischemic stroke, then no further workup at this time.  Ritta Slot, MD [facility] [phone]  If 7pm- 7am, please page neurology on call as listed in AMION.

## 2016-03-04 ENCOUNTER — Encounter: Payer: Self-pay | Admitting: Neurology

## 2016-03-04 ENCOUNTER — Ambulatory Visit (INDEPENDENT_AMBULATORY_CARE_PROVIDER_SITE_OTHER): Payer: 59 | Admitting: Neurology

## 2016-03-04 VITALS — BP 105/69 | HR 68 | Ht 65.0 in | Wt 216.0 lb

## 2016-03-04 DIAGNOSIS — I7774 Dissection of vertebral artery: Secondary | ICD-10-CM

## 2016-03-04 DIAGNOSIS — G43109 Migraine with aura, not intractable, without status migrainosus: Secondary | ICD-10-CM

## 2016-03-04 MED ORDER — FOLIC ACID 1 MG PO TABS: 1.0000 mg | ORAL_TABLET | Freq: Every day | ORAL | Status: DC

## 2016-03-04 MED ORDER — TOPIRAMATE 25 MG PO TABS: ORAL_TABLET | ORAL | Status: DC

## 2016-03-04 NOTE — Progress Notes (Signed)
Reason for visit: Vertebral artery dissection  [facility]  [name] is a 25 y.o. female  History of present illness:  Christine Wilkerson is a 25 year old left-handed white female with a history of an admission to the hospital on 02/05/2016. The patient had onset of a black line in the vision involving the left eye only. Over several minutes, this evolved into blurring of vision in the left inferior quadrant and eventually the patient had alteration in vision throughout the entire periphery, with only central vision left. The patient is fairly certain that only the left eye was involved. The patient indicated that this visual change occurred about 5 minutes after she had turned her head to the right, and then developed sudden onset of pain in the left neck area. The patient had some photophobia and phonophobia with the headache, some nausea and some dizziness. She denied any numbness or weakness of the extremities. She had no syncope. She has had daily headaches since that time that have been migratory around the head, sometimes behind one eye or the other, sometimes on the top of the head or in the temporal areas. The patient was admitted to the hospital on February 15, she underwent a MRI of the brain that was unremarkable, CT angiogram revealed what appeared to be a dissection of the left vertebral artery without vascular stenosis. The patient was placed on low-dose aspirin, and she remains on this medication. She has not had any significant change in her clinical condition. She comes to the office today for an evaluation.  Past Medical History  Diagnosis Date  . Asthma     Past Surgical History  Procedure Laterality Date  . Cesarean section    . Cesarean section      Family History  Problem Relation Age of Onset  . Rheum arthritis Mother   . Bell's palsy Mother   . Osteoporosis Mother   . Diabetes Father   . Neuropathy Sister     CRPS  . Asthma Sister   .  Diabetes Brother   . Migraines Neg Hx   . Crohn's disease Brother   . Asthma Sister   . Asthma Sister   . Asthma Sister     Social history:  reports that she has never smoked. She does not have any smokeless tobacco history on file. She reports that she does not drink alcohol or use illicit drugs.  Medications:  Prior to Admission medications   Medication Sig Start Date End Date Taking? Authorizing Provider  albuterol (PROVENTIL HFA;VENTOLIN HFA) 108 (90 Base) MCG/ACT inhaler Inhale 2 puffs into the lungs as needed. wheezing   Yes Historical Provider, MD  aspirin 325 MG tablet Take 1 tablet (325 mg total) by mouth daily. 02/05/16  Yes Belkys A Regalado, MD  oxyCODONE (OXY IR/ROXICODONE) 5 MG immediate release tablet Take 1 tablet (5 mg total) by mouth every 6 (six) hours as needed for severe pain. 02/05/16  Yes Belkys A Regalado, MD  Prenatal MV-Min-Fe Fum-FA-DHA (PRENATAL 1 PO) Take 1 tablet by mouth daily.   Yes Historical Provider, MD      Allergies  Allergen Reactions  . Other     eggwhites    ROS:  Out of a complete 14 system review of symptoms, the patient complains only of the following symptoms, and all other reviewed systems are negative.  Fatigue Dizziness Blurred vision Easy bruising Headache  Blood pressure 105/69, pulse 68, height 5\' 5"  (1.651 m), weight 216 lb (  97.977 kg).  Physical Exam  General: The patient is alert and cooperative at the time of the examination. The patient is markedly obese.  Eyes: Pupils are equal, round, and reactive to light. Discs are flat bilaterally.  Neck: The neck is supple, no carotid bruits are noted.  Respiratory: The respiratory examination is clear.  Cardiovascular: The cardiovascular examination reveals a regular rate and rhythm, no obvious murmurs or rubs are noted.  Skin: Extremities are without significant edema.  Neurologic Exam  Mental status: The patient is alert and oriented x 3 at the time of the examination.  The patient has apparent normal recent and remote memory, with an apparently normal attention span and concentration ability.  Cranial nerves: Facial symmetry is present. There is good sensation of the face to pinprick and soft touch bilaterally. The strength of the facial muscles and the muscles to head turning and shoulder shrug are normal bilaterally. Speech is well enunciated, no aphasia or dysarthria is noted. Extraocular movements are full. Visual fields are full. The tongue is midline, and the patient has symmetric elevation of the soft palate. No obvious hearing deficits are noted.  Motor: The motor testing reveals 5 over 5 strength of all 4 extremities. Good symmetric motor tone is noted throughout.  Sensory: Sensory testing is intact to pinprick, soft touch, vibration sensation, and position sense on all 4 extremities. No evidence of extinction is noted.  Coordination: Cerebellar testing reveals good finger-nose-finger and heel-to-shin bilaterally.  Gait and station: Gait is normal. Tandem gait is normal. Romberg is negative. No drift is seen.  Reflexes: Deep tendon reflexes are symmetric and normal bilaterally. Toes are downgoing bilaterally.   MRI head 02/05/16:  IMPRESSION: 1. No acute infarct identified. Normal noncontrast MRI appearance of the brain. 2. Negative intracranial MRA. Posterior circulation, including the intracranial left vertebral artery, appears normal limits. See neck CTA findings from 0041 hours today regarding abnormal cervical left vertebral artery.  * MRI scan images were reviewed online. I agree with the written report.   CTA head and neck 02/05/16:  IMPRESSION: CT HEAD: Normal.  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    Assessment/Plan:  1. Left vertebral artery dissection  2. Monocular visual disturbance, left eye  3. Headache  The clinical  history given by the patient is most consistent with a migraine headache. The patient has had some evidence of a vertebral artery dissection by CT angiogram, but I do not believe that all of her symptoms are related to this. The patient will be placed on Topamax, and she will follow-up in 4 months. We will consider a repeat CT angiogram of the neck at that time. She is remain on aspirin, she was given a prescription for folic acid 1 mg daily. She does not have a primary care physician.  Marlan Palau MD 03/04/2016 7:13 PM  [facility] [address] [place], [place] [address]  Phone [phone] Fax [phone]

## 2016-03-04 NOTE — Patient Instructions (Signed)
 Topamax (topiramate) is a seizure medication that has an FDA approval for seizures and for migraine headache. Potential side effects of this medication include weight loss, cognitive slowing, tingling in the fingers and toes, and carbonated drinks will taste bad. If any significant side effects are noted on this drug, please contact our office.  Migraine Headache A migraine headache is an intense, throbbing pain on one or both sides of your head. A migraine can last for 30 minutes to several hours. CAUSES  The exact cause of a migraine headache is not always known. However, a migraine may be caused when nerves in the brain become irritated and release chemicals that cause inflammation. This causes pain. Certain things may also trigger migraines, such as:  Alcohol.  Smoking.  Stress.  Menstruation.  Aged cheeses.  Foods or drinks that contain nitrates, glutamate, aspartame, or tyramine.  Lack of sleep.  Chocolate.  Caffeine.  Hunger.  Physical exertion.  Fatigue.  Medicines used to treat chest pain (nitroglycerine), birth control pills, estrogen, and some blood pressure medicines. SIGNS AND SYMPTOMS  Pain on one or both sides of your head.  Pulsating or throbbing pain.  Severe pain that prevents daily activities.  Pain that is aggravated by any physical activity.  Nausea, vomiting, or both.  Dizziness.  Pain with exposure to bright lights, loud noises, or activity.  General sensitivity to bright lights, loud noises, or smells. Before you get a migraine, you may get warning signs that a migraine is coming (aura). An aura may include:  Seeing flashing lights.  Seeing bright spots, halos, or zigzag lines.  Having tunnel vision or blurred vision.  Having feelings of numbness or tingling.  Having trouble talking.  Having muscle weakness. DIAGNOSIS  A migraine headache is often diagnosed based on:  Symptoms.  Physical exam.  A CT scan or MRI of your  head. These imaging tests cannot diagnose migraines, but they can help rule out other causes of headaches. TREATMENT Medicines may be given for pain and nausea. Medicines can also be given to help prevent recurrent migraines.  HOME CARE INSTRUCTIONS  Only take over-the-counter or prescription medicines for pain or discomfort as directed by your health care provider. The use of long-term narcotics is not recommended.  Lie down in a dark, quiet room when you have a migraine.  Keep a journal to find out what may trigger your migraine headaches. For example, write down:  What you eat and drink.  How much sleep you get.  Any change to your diet or medicines.  Limit alcohol consumption.  Quit smoking if you smoke.  Get 7-9 hours of sleep, or as recommended by your health care provider.  Limit stress.  Keep lights dim if bright lights bother you and make your migraines worse. SEEK IMMEDIATE MEDICAL CARE IF:   Your migraine becomes severe.  You have a fever.  You have a stiff neck.  You have vision loss.  You have muscular weakness or loss of muscle control.  You start losing your balance or have trouble walking.  You feel faint or pass out.  You have severe symptoms that are different from your first symptoms. MAKE SURE YOU:   Understand these instructions.  Will watch your condition.  Will get help right away if you are not doing well or get worse.   This information is not intended to replace advice given to you by your health care provider. Make sure you discuss any questions you have with your   health care provider.   Document Released: 12/07/2005 Document Revised: 12/28/2014 Document Reviewed: 08/14/2013 Elsevier Interactive Patient Education 2016 Elsevier Inc.  

## 2016-03-10 ENCOUNTER — Telehealth: Payer: Self-pay | Admitting: Neurology

## 2016-03-10 NOTE — Telephone Encounter (Signed)
I called the patient. She is having some blurred vision on the Topamax. This is intermittent and is not severe. She  Is to continue the medication for now. She is just on a 25 mg dose so far. She is still having daily headache.

## 2016-03-11 NOTE — Telephone Encounter (Signed)
Please see other phone note from this date.

## 2016-05-07 ENCOUNTER — Telehealth: Payer: Self-pay | Admitting: Neurology

## 2016-05-07 NOTE — Telephone Encounter (Signed)
Spoke to pt. Says that she has tapered up to Topamax 3 tabs per day and plans on decreasing to 2 tabs daily for a week and then 1 tab daily for a week before stopping medication. Will call and let Dr. Anne HahnWillis know if she feels that she needs a different medication for migraines.

## 2016-05-07 NOTE — Telephone Encounter (Signed)
Events noted, if the headaches get worse, the patient is to contact me.

## 2016-05-07 NOTE — Telephone Encounter (Signed)
Pt called said she is wanting to stop topiramate (TOPAMAX) 25 MG tablet  medication and try to handle the HA's on her own. If she can't then she will seek Dr Anne HahnWillis help with a new medication. Please call pt

## 2016-07-08 ENCOUNTER — Encounter: Payer: Self-pay | Admitting: Adult Health

## 2016-07-08 ENCOUNTER — Ambulatory Visit (INDEPENDENT_AMBULATORY_CARE_PROVIDER_SITE_OTHER): Payer: PRIVATE HEALTH INSURANCE | Admitting: Adult Health

## 2016-07-08 VITALS — BP 99/74 | HR 68 | Ht 65.0 in | Wt 208.6 lb

## 2016-07-08 DIAGNOSIS — I7774 Dissection of vertebral artery: Secondary | ICD-10-CM | POA: Diagnosis not present

## 2016-07-08 NOTE — Progress Notes (Addendum)
PATIENT: Christine Wilkerson DOB: 02-Nov-1991  REASON FOR VISIT: follow up- migraine headaches HISTORY FROM: patient  HISTORY OF PRESENT ILLNESS: Christine Wilkerson  is a 25 year old female with a history of migraine headaches and left vertebral dissection. She returns today for follow-up. The patient was on Topamax to control her headaches. However she weaned herself off of these medications due to potential side effects. She states that her headaches have been under relatively good control off of Topamax. She has approximately 2 headaches a week but feels this is manageable. The patient reports that she continues to have light sensitivity even off of Topamax. She states that it really only occurs with bright lights. She states that she did follow up with ophthalmology who felt that she may be having ocular migraines. Patient continues to insist that she does not want to be on medication at this time. She is remain on aspirin. She returns today for an evaluation.  HISTORY 03/04/16: Christine Wilkerson is a 25 year old left-handed white female with a history of an admission to the hospital on 02/05/2016. The patient had onset of a black line in the vision involving the left eye only. Over several minutes, this evolved into blurring of vision in the left inferior quadrant and eventually the patient had alteration in vision throughout the entire periphery, with only central vision left. The patient is fairly certain that only the left eye was involved. The patient indicated that this visual change occurred about 5 minutes after she had turned her head to the right, and then developed sudden onset of pain in the left neck area. The patient had some photophobia and phonophobia with the headache, some nausea and some dizziness. She denied any numbness or weakness of the extremities. She had no syncope. She has had daily headaches since that time that have been migratory around the head, sometimes behind one eye or the  other, sometimes on the top of the head or in the temporal areas. The patient was admitted to the hospital on February 15, she underwent a MRI of the brain that was unremarkable, CT angiogram revealed what appeared to be a dissection of the left vertebral artery without vascular stenosis. The patient was placed on low-dose aspirin, and she remains on this medication. She has not had any significant change in her clinical condition. She comes to the office today for an evaluation.  REVIEW OF SYSTEMS: Out of a complete 14 system review of symptoms, the patient complains only of the following symptoms, and all other reviewed systems are negative.  Light sensitivity, blurred vision  ALLERGIES: Allergies  Allergen Reactions  . Other     eggwhites  . Topamax [Topiramate] Other (See Comments)    "feeling tired, not myself"    HOME MEDICATIONS: Outpatient Prescriptions Prior to Visit  Medication Sig Dispense Refill  . albuterol (PROVENTIL HFA;VENTOLIN HFA) 108 (90 Base) MCG/ACT inhaler Inhale 2 puffs into the lungs as needed. wheezing    . aspirin 325 MG tablet Take 1 tablet (325 mg total) by mouth daily. 30 tablet 0  . folic acid (FOLVITE) 1 MG tablet Take 1 tablet (1 mg total) by mouth daily. 30 tablet 5  . Prenatal MV-Min-Fe Fum-FA-DHA (PRENATAL 1 PO) Take 1 tablet by mouth daily.    Marland Kitchen oxyCODONE (OXY IR/ROXICODONE) 5 MG immediate release tablet Take 1 tablet (5 mg total) by mouth every 6 (six) hours as needed for severe pain. 30 tablet 0  . topiramate (TOPAMAX) 25 MG tablet Take one tablet  at night for one week, then take 2 tablets at night for one week, then take 3 tablets at night. 90 tablet 3   No facility-administered medications prior to visit.    PAST MEDICAL HISTORY: Past Medical History  Diagnosis Date  . Asthma     PAST SURGICAL HISTORY: Past Surgical History  Procedure Laterality Date  . Cesarean section    . Cesarean section      FAMILY HISTORY: Family History    Problem Relation Age of Onset  . Rheum arthritis Mother   . Bell's palsy Mother   . Osteoporosis Mother   . Diabetes Father   . Neuropathy Sister     CRPS  . Asthma Sister   . Diabetes Brother   . Migraines Neg Hx   . Crohn's disease Brother   . Asthma Sister   . Asthma Sister   . Asthma Sister     SOCIAL HISTORY: Social History   Social History  . Marital Status: Married    Spouse Name: N/A  . Number of Children: N/A  . Years of Education: N/A   Occupational History  . Not on file.   Social History Main Topics  . Smoking status: Never Smoker   . Smokeless tobacco: Not on file  . Alcohol Use: No  . Drug Use: No  . Sexual Activity: Not on file   Other Topics Concern  . Not on file   Social History Narrative   Pt married, lives at home.  HS Graduate.  Unemployed.  Has 2 children.        PHYSICAL EXAM  Filed Vitals:   07/08/16 1300  BP: 99/74  Pulse: 68  Height: 5\' 5"  (1.651 m)  Weight: 208 lb 9.6 oz (94.62 kg)   Body mass index is 34.71 kg/(m^2).  Generalized: Well developed, in no acute distress   Neurological examination  Mentation: Alert oriented to time, place, history taking. Follows all commands speech and language fluent Cranial nerve II-XII: Pupils were equal round reactive to light. Extraocular movements were full, visual field were full on confrontational test. Facial sensation and strength were normal. Uvula tongue midline. Head turning and shoulder shrug  were normal and symmetric. Motor: The motor testing reveals 5 over 5 strength of all 4 extremities. Good symmetric motor tone is noted throughout.  Sensory: Sensory testing is intact to soft touch on all 4 extremities. No evidence of extinction is noted.  Coordination: Cerebellar testing reveals good finger-nose-finger and heel-to-shin bilaterally.  Gait and station: Gait is normal. Tandem gait is normal. Romberg is negative. No drift is seen.  Reflexes: Deep tendon reflexes are symmetric  and normal bilaterally.   DIAGNOSTIC DATA (LABS, IMAGING, TESTING) - I reviewed patient records, labs, notes, testing and imaging myself where available.  Lab Results  Component Value Date   WBC 9.2 02/05/2016   HGB 12.9 02/05/2016   HCT 40.7 02/05/2016   MCV 86.8 02/05/2016   PLT 273 02/05/2016      Component Value Date/Time   NA 142 02/05/2016 0120   K 4.0 02/05/2016 0120   CL 105 02/05/2016 0120   CO2 25 02/05/2016 0120   GLUCOSE 96 02/05/2016 0120   BUN 14 02/05/2016 0120   CREATININE 0.59 02/05/2016 0548   CALCIUM 9.8 02/05/2016 0120   GFRNONAA >60 02/05/2016 0548   GFRAA >60 02/05/2016 0548      ASSESSMENT AND PLAN 25 y.o. year old female  has a past medical history of Asthma. here with:  1. Migraine headaches  Overall the patient is doing well. She does not wish to be on any preventative medications for her headaches. We will repeat the CT angiogram of the neck to evaluate for the left vertebral dissection. Patient advised that if her symptoms worsen or she develops any new symptoms she should let us know. Will follow-up in 6 months or sooner if needed.     Butch PennyMegan Chike Farrington, MSN, NP-C 07/08/2016, 1:16 PM [facility] [address], [address] [place], [place] [id number] [phone]

## 2016-07-08 NOTE — Progress Notes (Signed)
I have read the note, and I agree with the clinical assessment and plan.  Jurgen Groeneveld KEITH   

## 2016-07-08 NOTE — Patient Instructions (Signed)
Repeat Ct angio of head If your symptoms worsen or you develop new symptoms please let us know.

## 2016-07-20 ENCOUNTER — Telehealth: Payer: Self-pay | Admitting: Adult Health

## 2016-07-20 NOTE — Telephone Encounter (Signed)
Pt called to wanting to schedule MRI

## 2016-07-30 ENCOUNTER — Telehealth: Payer: Self-pay | Admitting: Adult Health

## 2016-07-30 NOTE — Telephone Encounter (Signed)
error 

## 2016-07-30 NOTE — Addendum Note (Signed)
Addended by: Enedina FinnerMILLIKAN, Estreya Clay P on: 07/30/2016 09:08 AM   Modules accepted: Orders

## 2016-07-31 ENCOUNTER — Ambulatory Visit
Admission: RE | Admit: 2016-07-31 | Discharge: 2016-07-31 | Disposition: A | Discharge status: Home / Self Care | Payer: 59 | Source: Ambulatory Visit | Attending: Adult Health | Admitting: Adult Health

## 2016-07-31 ENCOUNTER — Encounter: Payer: Self-pay | Admitting: Radiology

## 2016-07-31 DIAGNOSIS — I7774 Dissection of vertebral artery: Secondary | ICD-10-CM

## 2016-07-31 MED ORDER — IOPAMIDOL (ISOVUE-370) INJECTION 76%
75.0000 mL | Freq: Once | INTRAVENOUS | Status: AC | PRN
  Administered 2016-07-31: 100 mL via INTRAVENOUS

## 2016-08-03 ENCOUNTER — Telehealth: Payer: Self-pay

## 2016-08-03 NOTE — Telephone Encounter (Signed)
-----   Message from Butch PennyMegan Millikan, NP sent at 08/03/2016  7:43 AM EDT ----- CTA neck unremarkable. Please call patient.

## 2016-08-03 NOTE — Telephone Encounter (Signed)
LM with results below (per DPR). Left call back number for further questions.

## 2016-08-11 NOTE — Telephone Encounter (Signed)
[facility]. I called patient to give her their number but she did not answer. Left a VM.

## 2016-08-11 NOTE — Telephone Encounter (Signed)
Pt returned Christine Wilkerson's call. Pt stated she has already had a CT done.

## 2017-01-11 ENCOUNTER — Ambulatory Visit: Payer: Self-pay | Admitting: Adult Health

## 2018-07-27 ENCOUNTER — Institutional Professional Consult (permissible substitution): Payer: 59 | Admitting: Neurology

## 2019-11-28 ENCOUNTER — Encounter: Payer: Self-pay | Admitting: Pediatrics

## 2019-11-28 ENCOUNTER — Other Ambulatory Visit: Payer: Self-pay

## 2019-11-28 ENCOUNTER — Ambulatory Visit: Payer: BC Managed Care – PPO | Admitting: Pediatrics

## 2019-11-28 VITALS — BP 110/68 | HR 88 | Temp 98.7°F | Resp 16 | Ht 64.76 in | Wt 231.6 lb

## 2019-11-28 DIAGNOSIS — J4540 Moderate persistent asthma, uncomplicated: Secondary | ICD-10-CM

## 2019-11-28 DIAGNOSIS — L503 Dermatographic urticaria: Secondary | ICD-10-CM | POA: Diagnosis not present

## 2019-11-28 DIAGNOSIS — E669 Obesity, unspecified: Secondary | ICD-10-CM

## 2019-11-28 DIAGNOSIS — G43109 Migraine with aura, not intractable, without status migrainosus: Secondary | ICD-10-CM | POA: Diagnosis not present

## 2019-11-28 DIAGNOSIS — J3089 Other allergic rhinitis: Secondary | ICD-10-CM

## 2019-11-28 MED ORDER — ALBUTEROL SULFATE HFA 108 (90 BASE) MCG/ACT IN AERS: INHALATION_SPRAY | RESPIRATORY_TRACT | 1 refills | Status: DC

## 2019-11-28 MED ORDER — ALBUTEROL SULFATE (2.5 MG/3ML) 0.083% IN NEBU: INHALATION_SOLUTION | RESPIRATORY_TRACT | 1 refills | Status: AC

## 2019-11-28 MED ORDER — OMEPRAZOLE 20 MG PO CPDR: DELAYED_RELEASE_CAPSULE | ORAL | 5 refills | Status: DC

## 2019-11-28 MED ORDER — BUDESONIDE-FORMOTEROL FUMARATE 160-4.5 MCG/ACT IN AERO: INHALATION_SPRAY | RESPIRATORY_TRACT | 5 refills | Status: DC

## 2019-11-28 NOTE — Progress Notes (Addendum)
[place] [address] Dept: [phone]  New Patient Note  Patient ID: Christine Wilkerson, female    DOB: March 05, 1991  Age: 28 y.o. MRN: [id number] Date of Office Visit: 11/28/2019 Referring provider: [facility] [place] No address on file    Chief Complaint: Asthma and Allergic Rhinitis   HPI Christine Wilkerson presents for evaluation of asthma and allergic rhinitis.. She had seasonal allergic rhinitis as a child and has continued to have problems in the spring and fall.  She had asthma in childhood and then she got better until November 2019 when she had suspected influenza and had several emergency room visits for 1 month because of exacerbations of asthma.  She had 2 normal chest x-rays then.  She is on Advair 100/50-one  puff every 12 hours and montelukast 10 mg once a day.. She last received prednisone in July of this year.  She does not have a history of recurrent pneumonias or sinus infections  In the past year she has noted sore throats with weather changes.  At times she has noted heartburn  If she eats egg whites she has an upset stomach and intestinal cramps and diarrhea at times , but no skin or cardiorespiratory symptoms.  She can eat foods cooked with eggs including pancakes without any problems.  She does not have a history of eczema  Review of Systems  Constitutional: Negative.   HENT:       Seasonal allergic rhinitis since childhood.  Tonsillectomy  Eyes: Negative.   Respiratory:       Asthma since childhood.  Worse in the past year  Cardiovascular: Negative.   Gastrointestinal:       Occasional heartburn  Genitourinary:       History of UTIs  Musculoskeletal: Negative.   Skin: Negative.   Neurological:       History of migraine headaches  Endo/Heme/Allergies:       No diabetes or thyroid disease  Psychiatric/Behavioral: Negative.     Outpatient Encounter Medications as of 11/28/2019  Medication Sig  . albuterol  (PROVENTIL HFA;VENTOLIN HFA) 108 (90 Base) MCG/ACT inhaler Inhale 2 puffs into the lungs as needed. wheezing  . Ascorbic Acid (VITAMIN C) 500 MG CHEW Chew by mouth.  Marland Kitchen CALCIUM/MAGNESIUM/ZINC FORMULA PO Take by mouth daily.  . cetirizine (ZYRTEC) 10 MG tablet Take by mouth daily.  . Cranberry 500 MG TABS Take 500 mg by mouth at bedtime.  . fluticasone (FLONASE) 50 MCG/ACT nasal spray Place 1 spray into both nostrils at bedtime.  . Fluticasone-Salmeterol (ADVAIR) 100-50 MCG/DOSE AEPB Inhale 1 puff into the lungs 2 (two) times daily at 10 AM and 5 PM.  . SINGULAIR 10 MG tablet at bedtime.  . [DISCONTINUED] albuterol (VENTOLIN HFA) 108 (90 Base) MCG/ACT inhaler Inhale into the lungs.  Marland Kitchen albuterol (PROAIR HFA) 108 (90 Base) MCG/ACT inhaler 2 puffs every 4 hours as needed for coughing or wheezing spells  . albuterol (PROVENTIL) (2.5 MG/3ML) 0.083% nebulizer solution Use 1 unit dose every 4 hours as needed for coughing or wheezing spells  . budesonide-formoterol (SYMBICORT) 160-4.5 MCG/ACT inhaler 2 puffs every 12 hours to prevent coughing or wheezing. Rinse, gargle and spit out after use  . omeprazole (PRILOSEC) 20 MG capsule One capsule twice a day for reflux  . [DISCONTINUED] aspirin 325 MG tablet Take 1 tablet (325 mg total) by mouth daily.  . [DISCONTINUED] folic acid (FOLVITE) 1 MG tablet Take 1 tablet (1 mg total) by mouth daily.  . [  DISCONTINUED] Prenatal MV-Min-Fe Fum-FA-DHA (PRENATAL 1 PO) Take 1 tablet by mouth daily.   No facility-administered encounter medications on file as of 11/28/2019.      Drug Allergies:  Allergies  Allergen Reactions  . Amoxicillin Rash  . Other     eggwhites  . Topamax [Topiramate] Other (See Comments)    "feeling tired, not myself"    Family History: Christine Wilkerson's family history includes Allergic rhinitis in her daughter, mother, sister, sister, sister, sister, and son; Asthma in her daughter, mother, sister, sister, sister, sister, and son; Bell's palsy in her  mother; Crohn's disease in her brother; Diabetes in her brother and father; Eczema in her son; Food Allergy in her son; Neuropathy in her sister; Osteoporosis in her mother; Rheum arthritis in her mother; Urticaria in her son..  Family history is negative for recurrent pneumonias or sinus infections.  Family history is negative for emphysema.  Social and environmental.  There are no pets in the home.  She is not exposed to cigarette smoking.  She has never smoked cigarettes in the past.  She is a homemaker.  Physical Exam: BP 110/68   Pulse 88   Temp 98.7 F (37.1 C) (Oral)   Resp 16   Ht 5' 4.76" (1.645 m)   Wt 231 lb 9.6 oz (105.1 kg)   SpO2 97%   BMI 38.82 kg/m    Physical Exam Vitals signs reviewed.  Constitutional:      Appearance: Normal appearance. She is obese.  HENT:     Head:     Comments: Eyes normal.  Ears normal.  Nose mild swelling of nasal turbinates.  Pharynx normal. Neck:     Musculoskeletal: Neck supple.     Comments: No thyromegaly Cardiovascular:     Rate and Rhythm: Normal rate and regular rhythm.     Comments: S1-S2 normal no murmur Pulmonary:     Comments: Clear to percussion and auscultation Abdominal:     Palpations: Abdomen is soft.     Tenderness: There is no abdominal tenderness.     Comments: No hepatosplenomegaly  Lymphadenopathy:     Cervical: No cervical adenopathy.  Skin:    Comments: Clear  Neurological:     General: No focal deficit present.     Mental Status: She is alert and oriented to person, place, and time. Mental status is at baseline.  Psychiatric:        Mood and Affect: Mood normal.        Behavior: Behavior normal.        Thought Content: Thought content normal.        Judgment: Judgment normal.     Diagnostics: FVC 4.22 L FEV1 3.67 L.  Predicted FVC 3.91 L predicted FEV1 3.34 L.  After albuterol 2 puffs FVC 4.26 L FEV1 3.73 L-the spirometry is in the normal range and there was no significant improvement after albuterol   Allergy skin test were positive to grass pollen, ragweed and some molds on intradermal testing only.  Skin testing to egg white was negative   Assessment  Assessment and Plan: 1. Moderate persistent asthma without complication   2. Other allergic rhinitis   3. Migraine with aura and without status migrainosus, not intractable   4. Dermographia   5. Obesity (BMI 35.0-39.9 without comorbidity)     Meds ordered this encounter  Medications  . budesonide-formoterol (SYMBICORT) 160-4.5 MCG/ACT inhaler    Sig: 2 puffs every 12 hours to prevent coughing or wheezing. Rinse, gargle and  spit out after use    Dispense:  1 Inhaler    Refill:  5  . albuterol (PROAIR HFA) 108 (90 Base) MCG/ACT inhaler    Sig: 2 puffs every 4 hours as needed for coughing or wheezing spells    Dispense:  18 g    Refill:  1  . albuterol (PROVENTIL) (2.5 MG/3ML) 0.083% nebulizer solution    Sig: Use 1 unit dose every 4 hours as needed for coughing or wheezing spells    Dispense:  75 mL    Refill:  1  . omeprazole (PRILOSEC) 20 MG capsule    Sig: One capsule twice a day for reflux    Dispense:  60 capsule    Refill:  5    Patient Instructions  Environmental control of dust and mold Zyrtec 10 mg-take 1 tablet once a day for runny nose or itchy eyes Nasal saline irrigations at night followed by fluticasone 2 sprays per nostril at night  Montelukast 10 mg-take 1 tablet once a day to prevent coughing or wheezing Symbicort 160-2 puffs every 12 hours to prevent coughing or wheezing instead of Advair Proair 2 puffs every 4 hours if needed for wheezing or coughing spells or instead albuterol 0.083% - 1 unit dose every 4 hours if needed.  You may use ProAir 2 puffs 5 to 15 minutes before exercise Add prednisone 20 mg twice a day for 3 days, 20 mg on the fourth day, 10 mg on the fifth day to bring your asthma under control  Omeprazole 20 mg twice a day to see if it helps with your sore throats from possible reflux   Since  egg whites give you some abdominal pain and sometimes diarrhea, continue avoiding egg whites but you may use foods cooked with egg  as you have tolerated in the past.   Return in about 4 weeks (around 12/26/2019).   Thank you for the opportunity to care for this patient.  Please do not hesitate to contact me with questions.  Tonette Bihari, M.D.  [facility] [address] [place], [place] [address] [phone]

## 2019-11-28 NOTE — Patient Instructions (Addendum)
Environmental control of dust and mold Zyrtec 10 mg-take 1 tablet once a day for runny nose or itchy eyes Nasal saline irrigations at night followed by fluticasone 2 sprays per nostril at night  Montelukast 10 mg-take 1 tablet once a day to prevent coughing or wheezing Symbicort 160-2 puffs every 12 hours to prevent coughing or wheezing instead of Advair Proair 2 puffs every 4 hours if needed for wheezing or coughing spells or instead albuterol 0.083% - 1 unit dose every 4 hours if needed.  You may use ProAir 2 puffs 5 to 15 minutes before exercise Add prednisone 20 mg twice a day for 3 days, 20 mg on the fourth day, 10 mg on the fifth day to bring your asthma under control  Omeprazole 20 mg twice a day to see if it helps with your sore throats from possible reflux  Since  egg whites give you some abdominal pain and sometimes diarrhea, continue avoiding egg whites but you may use foods cooked with egg  as you have tolerated in the past.

## 2019-12-25 ENCOUNTER — Ambulatory Visit: Payer: BC Managed Care – PPO | Admitting: Pediatrics

## 2020-01-15 ENCOUNTER — Ambulatory Visit: Payer: BC Managed Care – PPO | Admitting: Pediatrics

## 2020-01-16 ENCOUNTER — Ambulatory Visit: Payer: BC Managed Care – PPO | Admitting: Pediatrics

## 2020-01-16 ENCOUNTER — Encounter: Payer: Self-pay | Admitting: Pediatrics

## 2020-01-16 ENCOUNTER — Other Ambulatory Visit: Payer: Self-pay

## 2020-01-16 VITALS — BP 104/68 | HR 72 | Temp 98.7°F | Resp 16

## 2020-01-16 DIAGNOSIS — L503 Dermatographic urticaria: Secondary | ICD-10-CM | POA: Diagnosis not present

## 2020-01-16 DIAGNOSIS — K219 Gastro-esophageal reflux disease without esophagitis: Secondary | ICD-10-CM

## 2020-01-16 DIAGNOSIS — J4540 Moderate persistent asthma, uncomplicated: Secondary | ICD-10-CM | POA: Diagnosis not present

## 2020-01-16 DIAGNOSIS — J3089 Other allergic rhinitis: Secondary | ICD-10-CM | POA: Diagnosis not present

## 2020-01-16 DIAGNOSIS — E669 Obesity, unspecified: Secondary | ICD-10-CM | POA: Diagnosis not present

## 2020-01-16 NOTE — Progress Notes (Signed)
[place] [address] Dept: [phone]  FOLLOW UP NOTE  Patient ID: Christine Wilkerson, female    DOB: September 30, 1991  Age: 29 y.o. MRN: [id number] Date of Office Visit: 01/16/2020  Assessment  Chief Complaint: Asthma (doing good)  HPI Christine Wilkerson presents for follow-up of asthma and allergic rhinitis.  Her asthma is well controlled with the  use of montelukast 10 mg once a day and Symbicort 160-2 puffs every 12 hours.  Her nasal symptoms are controlled by Zyrtec 10 mg once a day and nasal rinses at night followed by fluticasone 2 sprays per nostril at night.  She is not having any more sore throats with the use of omeprazole 20 mg twice a day   Drug Allergies:  Allergies  Allergen Reactions  . Amoxicillin Rash  . Other     eggwhites  . Topamax [Topiramate] Other (See Comments)    "feeling tired, not myself"    Physical Exam: BP 104/68 (BP Location: Left Arm, Patient Position: Sitting, Cuff Size: Normal)   Pulse 72   Temp 98.7 F (37.1 C) (Oral)   Resp 16   SpO2 98%    Physical Exam Vitals reviewed.  Constitutional:      Appearance: Normal appearance. She is obese.  HENT:     Head:     Comments: Eyes normal.  Ears normal.  Nose normal.  Pharynx normal. Cardiovascular:     Rate and Rhythm: Normal rate and regular rhythm.     Comments: S1-S2 normal no murmurs Pulmonary:     Comments: Clear to percussion and auscultation Musculoskeletal:     Cervical back: Neck supple.  Lymphadenopathy:     Cervical: No cervical adenopathy.  Neurological:     General: No focal deficit present.     Mental Status: She is alert and oriented to person, place, and time. Mental status is at baseline.  Psychiatric:        Mood and Affect: Mood normal.        Behavior: Behavior normal.        Thought Content: Thought content normal.        Judgment: Judgment normal.     Diagnostics: FVC 4.20 L FEV1 3.79 L.  Predicted FVC 3.81 L predicted FEV1 3.26 L-the  spirometry is in the normal range  Assessment and Plan: 1. Moderate persistent asthma without complication   2. Other allergic rhinitis   3. Dermographia   4. Obesity (BMI 35.0-39.9 without comorbidity)   5. Gastroesophageal reflux disease without esophagitis     No orders of the defined types were placed in this encounter.   Patient Instructions  Zyrtec 10 mg-take 1 tablet once a day for runny nose or itchy eyes Nasal saline irrigations at night followed by fluticasone 2 sprays per nostril at night  Montelukast 10 mg-take 1 tablet once a day to prevent coughing or wheezing Symbicort 160-2 puffs every 12 hours to prevent coughing or wheezing ProAir 2 puffs every 4 hours if needed for wheezing or coughing spells or instead albuterol 0.083% 1 unit dose every 4 hours if needed.  You may use ProAir 2 puffs 5 to 15 minutes before exercise  Decrease omeprazole to 20 mg once a day but if you do not do as well with your reflux you may use 20 mg twice a day  Continue avoiding egg whites since they  gave you some abdominal pain and diarrhea.  You may use foods cooked with eggs if you can  tolerate them.  Call us if you are not doing well on this treatment plan   Return in about 6 months (around 07/15/2020).    Thank you for the opportunity to care for this patient.  Please do not hesitate to contact me with questions.  Tonette Bihari, M.D.  [facility] [address] [place], [place] [address] [phone]

## 2020-01-16 NOTE — Patient Instructions (Signed)
Zyrtec 10 mg-take 1 tablet once a day for runny nose or itchy eyes Nasal saline irrigations at night followed by fluticasone 2 sprays per nostril at night  Montelukast 10 mg-take 1 tablet once a day to prevent coughing or wheezing Symbicort 160-2 puffs every 12 hours to prevent coughing or wheezing ProAir 2 puffs every 4 hours if needed for wheezing or coughing spells or instead albuterol 0.083% 1 unit dose every 4 hours if needed.  You may use ProAir 2 puffs 5 to 15 minutes before exercise  Decrease omeprazole to 20 mg once a day but if you do not do as well with your reflux you may use 20 mg twice a day  Continue avoiding egg whites since they  gave you some abdominal pain and diarrhea.  You may use foods cooked with eggs if you can tolerate them.  Call us if you are not doing well on this treatment plan

## 2020-01-27 ENCOUNTER — Other Ambulatory Visit: Payer: Self-pay | Admitting: Pediatrics

## 2020-05-09 ENCOUNTER — Other Ambulatory Visit: Payer: Self-pay | Admitting: Pediatrics

## 2020-05-13 ENCOUNTER — Other Ambulatory Visit: Payer: Self-pay | Admitting: Pediatrics

## 2020-06-12 ENCOUNTER — Other Ambulatory Visit: Payer: Self-pay | Admitting: Pediatrics

## 2020-07-17 ENCOUNTER — Other Ambulatory Visit: Payer: Self-pay

## 2020-07-17 ENCOUNTER — Encounter: Payer: Self-pay | Admitting: Allergy and Immunology

## 2020-07-17 ENCOUNTER — Ambulatory Visit (INDEPENDENT_AMBULATORY_CARE_PROVIDER_SITE_OTHER): Payer: BC Managed Care – PPO | Admitting: Allergy and Immunology

## 2020-07-17 VITALS — BP 106/66 | HR 87 | Temp 98.7°F | Resp 20 | Ht 64.57 in | Wt 231.0 lb

## 2020-07-17 DIAGNOSIS — K219 Gastro-esophageal reflux disease without esophagitis: Secondary | ICD-10-CM | POA: Diagnosis not present

## 2020-07-17 DIAGNOSIS — J4540 Moderate persistent asthma, uncomplicated: Secondary | ICD-10-CM | POA: Insufficient documentation

## 2020-07-17 DIAGNOSIS — J3089 Other allergic rhinitis: Secondary | ICD-10-CM

## 2020-07-17 DIAGNOSIS — H6983 Other specified disorders of Eustachian tube, bilateral: Secondary | ICD-10-CM | POA: Diagnosis not present

## 2020-07-17 DIAGNOSIS — H6980 Other specified disorders of Eustachian tube, unspecified ear: Secondary | ICD-10-CM | POA: Insufficient documentation

## 2020-07-17 MED ORDER — XHANCE 93 MCG/ACT NA EXHU: 2.0000 | INHALANT_SUSPENSION | Freq: Two times a day (BID) | NASAL | 12 refills | Status: DC | PRN

## 2020-07-17 MED ORDER — FAMOTIDINE 20 MG PO TABS: 20.0000 mg | ORAL_TABLET | Freq: Two times a day (BID) | ORAL | 5 refills | Status: DC

## 2020-07-17 MED ORDER — BUDESONIDE-FORMOTEROL FUMARATE 80-4.5 MCG/ACT IN AERO: 2.0000 | INHALATION_SPRAY | Freq: Two times a day (BID) | RESPIRATORY_TRACT | 5 refills | Status: DC

## 2020-07-17 NOTE — Assessment & Plan Note (Addendum)
Currently with suboptimal control.  Continue appropriate allergen avoidance measures.  A prescription has been provided for [facility], 2 actuations per nostril twice a day as needed. Proper technique has been discussed and demonstrated.  Nasal saline lavage (NeilMed) has been recommended as needed and prior to medicated nasal sprays along with instructions for proper administration.  Continue cetirizine 10 mg daily if needed.  If allergen avoidance measures and medications fail to adequately relieve symptoms, aeroallergen immunotherapy will be considered.

## 2020-07-17 NOTE — Progress Notes (Signed)
Follow-up Note  RE: Asal Teas MRN: [id number] DOB: [birth year] Date of Office Visit: 07/17/2020  Primary care provider: [facility] Referring provider: [facility]  History of present illness: Dymin Dingledine is a 29 y.o. female with asthma, allergic rhinitis, and acid reflux presenting today for follow-up.  She was last seen in this clinic on January 16, 2020.  She reports that over the past 2 months she has not required albuterol and denies limitations in normal daily activities and nocturnal awakenings due to lower respiratory symptoms.  She is currently taking Symbicort 160-4.5 g, 2 inhalations twice daily, and montelukast 10 mg daily at bedtime.  She reports that she has not using a spacer device with her HFA inhalers.  She denies side effects from montelukast. She reports that she has been experiencing nasal congestion and bilateral ear pressure.  In addition, she has been experiencing thick postnasal drainage "really bad", particularly after it rains.  She is attempting to control her nasal symptoms with fluticasone nasal spray and saline via bulb syringe. She reports that despite compliance with omeprazole 20 mg twice daily, she has still experiencing heartburn and water brash, typically 1 or more times per day.  In addition, she states that on occasion it feels like "food gets stuck" in her throat as she tries to swallow.  Assessment and plan: Moderate persistent asthma Well-controlled, we will stepdown therapy at this time.  A prescription has been provided for Symbicort (budesonide/formoterol) 80/4.5 g, 2 inhalations twice a day.  If lower respiratory symptoms progress in frequency and/or severity, the patient is to resume the previous dose.  To maximize pulmonary deposition, a spacer has been provided along with instructions for its proper administration with an HFA inhaler.  Continue montelukast 10 mg daily at  bedtime.  Continue albuterol HFA, 1 to 2 inhalations every 4-6 hours if needed.  Subjective and objective measures of pulmonary function will be followed and the treatment plan will be adjusted accordingly.  Allergic rhinitis Currently with suboptimal control.  Continue appropriate allergen avoidance measures.  A prescription has been provided for [facility], 2 actuations per nostril twice a day as needed. Proper technique has been discussed and demonstrated.  Nasal saline lavage (NeilMed) has been recommended as needed and prior to medicated nasal sprays along with instructions for proper administration.  Continue cetirizine 10 mg daily if needed.  If allergen avoidance measures and medications fail to adequately relieve symptoms, aeroallergen immunotherapy will be considered.  Eustachian tube dysfunction  Treatment plan as outlined above for allergic rhinitis.  For thick post nasal drainage, nasal congestion, ear pressure, and/or sinus pressure, add guaifenesin 1200 mg (Mucinex Maximum Strength) plus/minus pseudoephedrine 120 mg  twice daily as needed with adequate hydration as discussed. Pseudoephedrine is only to be used for short-term relief of nasal/sinus congestion. Long-term use is discouraged due to potential side effects.  If this problem persists or progresses despite treatment plan as outlined above, follow-up with ENT for further evaluation.  GERD (gastroesophageal reflux disease) Currently with suboptimal control.  Appropriate reflux lifestyle modifications have been provided in written form.  For now, continue omeprazole 20 mg twice daily as prescribed.  Add famotidine (Pepcid) 20 mg twice daily.  Given the poor control of acid reflux despite omeprazole twice daily, and given the history of occasional dysphagia, I have recommended evaluation by gastroenterologist.   Meds ordered this encounter  Medications  . Fluticasone Propionate (XHANCE) 93 MCG/ACT EXHU    Sig:  Place 2 sprays into both nostrils 2 (two) times daily as needed.    Dispense:  16 mL    Refill:  12  . budesonide-formoterol (SYMBICORT) 80-4.5 MCG/ACT inhaler    Sig: Inhale 2 puffs into the lungs 2 (two) times daily.    Dispense:  1 Inhaler    Refill:  5  . famotidine (PEPCID) 20 MG tablet    Sig: Take 1 tablet (20 mg total) by mouth 2 (two) times daily.    Dispense:  60 tablet    Refill:  5    Diagnostics: Spirometry:  Normal with an FEV1 of 111% predicted. This study was performed while the patient was asymptomatic.  Please see scanned spirometry results for details.    Physical examination: Blood pressure 106/66, pulse 87, temperature 98.7 F (37.1 C), temperature source Oral, resp. rate 20, height 5' 4.57" (1.64 m), weight (!) 231 lb (104.8 kg), SpO2 98 %.  General: Alert, interactive, in no acute distress. HEENT: TMs pearly gray, turbinates moderately edematous with clear discharge, post-pharynx moderately erythematous. Neck: Supple without lymphadenopathy. Lungs: Clear to auscultation without wheezing, rhonchi or rales. CV: Normal S1, S2 without murmurs. Skin: Warm and dry, without lesions or rashes.  The following portions of the patient's history were reviewed and updated as appropriate: allergies, current medications, past family history, past medical history, past social history, past surgical history and problem list.  Current Outpatient Medications  Medication Sig Dispense Refill  . albuterol (PROVENTIL) (2.5 MG/3ML) 0.083% nebulizer solution Use 1 unit dose every 4 hours as needed for coughing or wheezing spells 75 mL 1  . Ascorbic Acid (VITAMIN C) 500 MG CHEW Chew by mouth.    . budesonide-formoterol (SYMBICORT) 160-4.5 MCG/ACT inhaler 2 PUFFS EVERY 12 HOURS TO PREVENT COUGHING OR WHEEZING. RINSE, GARGLE AND SPIT OUT AFTER USE 10.2 Inhaler 0  . cetirizine (ZYRTEC) 10 MG tablet Take by mouth daily.    . Cranberry 500 MG TABS Take 500 mg by mouth at bedtime.    .  fluticasone (FLONASE) 50 MCG/ACT nasal spray Place 1 spray into both nostrils at bedtime.    . Melatonin-Pyridoxine (MELATIN PO) Take by mouth.    Marland Kitchen omeprazole (PRILOSEC) 20 MG capsule ONE CAPSULE TWICE A DAY FOR REFLUX 180 capsule 1  . Prenatal Vit-DSS-Fe Cbn-FA (PRENATAL AD PO) Take by mouth.    Marland Kitchen SINGULAIR 10 MG tablet at bedtime.    . VENTOLIN HFA 108 (90 Base) MCG/ACT inhaler 2 PUFFS EVERY 4 HOURS AS NEEDED FOR COUGHING OR WHEEZING SPELLS 18 g 1  . budesonide-formoterol (SYMBICORT) 80-4.5 MCG/ACT inhaler Inhale 2 puffs into the lungs 2 (two) times daily. 1 Inhaler 5  . famotidine (PEPCID) 20 MG tablet Take 1 tablet (20 mg total) by mouth 2 (two) times daily. 60 tablet 5  . Fluticasone Propionate (XHANCE) 93 MCG/ACT EXHU Place 2 sprays into both nostrils 2 (two) times daily as needed. 16 mL 12   No current facility-administered medications for this visit.    Allergies  Allergen Reactions  . Amoxicillin Rash  . Topamax [Topiramate] Other (See Comments)    "feeling tired, not myself"   Review of systems: Review of systems negative except as noted in HPI / PMHx.  Past Medical History:  Diagnosis Date  . Asthma     Family History  Problem Relation Age of Onset  . Rheum arthritis Mother   . Bell's palsy Mother   . Osteoporosis Mother   . Allergic rhinitis Mother   . Asthma  Mother   . Diabetes Father   . Neuropathy Sister        CRPS  . Asthma Sister   . Allergic rhinitis Sister   . Diabetes Brother   . Crohn's disease Brother   . Asthma Sister   . Allergic rhinitis Sister   . Asthma Sister   . Allergic rhinitis Sister   . Asthma Sister   . Allergic rhinitis Sister   . Asthma Son   . Allergic rhinitis Son   . Urticaria Son   . Eczema Son   . Food Allergy Son   . Asthma Daughter   . Allergic rhinitis Daughter   . Migraines Neg Hx     Social History   Socioeconomic History  . Marital status: Married    Spouse name: Not on file  . Number of children: Not on  file  . Years of education: Not on file  . Highest education level: Not on file  Occupational History  . Not on file  Tobacco Use  . Smoking status: Never Smoker  . Smokeless tobacco: Never Used  Vaping Use  . Vaping Use: Never used  Substance and Sexual Activity  . Alcohol use: No  . Drug use: No  . Sexual activity: Not on file  Other Topics Concern  . Not on file  Social History Narrative   Pt married, lives at home.  HS Graduate.  Unemployed.  Has 2 children.     Social Determinants of Health   Financial Resource Strain:   . Difficulty of Paying Living Expenses:   Food Insecurity:   . Worried About Programme researcher, broadcasting/film/video in the Last Year:   . Barista in the Last Year:   Transportation Needs:   . Freight forwarder (Medical):   Marland Kitchen Lack of Transportation (Non-Medical):   Physical Activity:   . Days of Exercise per Week:   . Minutes of Exercise per Session:   Stress:   . Feeling of Stress :   Social Connections:   . Frequency of Communication with Friends and Family:   . Frequency of Social Gatherings with Friends and Family:   . Attends Religious Services:   . Active Member of Clubs or Organizations:   . Attends Banker Meetings:   Marland Kitchen Marital Status:   Intimate Partner Violence:   . Fear of Current or Ex-Partner:   . Emotionally Abused:   Marland Kitchen Physically Abused:   . Sexually Abused:     I appreciate the opportunity to take part in Keimani's care. Please do not hesitate to contact me with questions.  Sincerely,   R. Jorene Guest, MD

## 2020-07-17 NOTE — Assessment & Plan Note (Signed)
   Treatment plan as outlined above for allergic rhinitis.  For thick post nasal drainage, nasal congestion, ear pressure, and/or sinus pressure, add guaifenesin 1200 mg (Mucinex Maximum Strength) plus/minus pseudoephedrine 120 mg  twice daily as needed with adequate hydration as discussed. Pseudoephedrine is only to be used for short-term relief of nasal/sinus congestion. Long-term use is discouraged due to potential side effects.  If this problem persists or progresses despite treatment plan as outlined above, follow-up with ENT for further evaluation.

## 2020-07-17 NOTE — Patient Instructions (Addendum)
Moderate persistent asthma Well-controlled, we will stepdown therapy at this time.  A prescription has been provided for Symbicort (budesonide/formoterol) 80/4.5 g, 2 inhalations twice a day.  If lower respiratory symptoms progress in frequency and/or severity, the patient is to resume the previous dose.  To maximize pulmonary deposition, a spacer has been provided along with instructions for its proper administration with an HFA inhaler.  Continue montelukast 10 mg daily at bedtime.  Continue albuterol HFA, 1 to 2 inhalations every 4-6 hours if needed.  Subjective and objective measures of pulmonary function will be followed and the treatment plan will be adjusted accordingly.  Allergic rhinitis Currently with suboptimal control.  Continue appropriate allergen avoidance measures.  A prescription has been provided for [facility], 2 actuations per nostril twice a day as needed. Proper technique has been discussed and demonstrated.  Nasal saline lavage (NeilMed) has been recommended as needed and prior to medicated nasal sprays along with instructions for proper administration.  Continue cetirizine 10 mg daily if needed.  If allergen avoidance measures and medications fail to adequately relieve symptoms, aeroallergen immunotherapy will be considered.  Eustachian tube dysfunction  Treatment plan as outlined above for allergic rhinitis.  For thick post nasal drainage, nasal congestion, ear pressure, and/or sinus pressure, add guaifenesin 1200 mg (Mucinex Maximum Strength) plus/minus pseudoephedrine 120 mg  twice daily as needed with adequate hydration as discussed. Pseudoephedrine is only to be used for short-term relief of nasal/sinus congestion. Long-term use is discouraged due to potential side effects.  If this problem persists or progresses despite treatment plan as outlined above, follow-up with ENT for further evaluation.  GERD (gastroesophageal reflux disease) Currently with  suboptimal control.  Appropriate reflux lifestyle modifications have been provided in written form.  For now, continue omeprazole 20 mg twice daily as prescribed.  Add famotidine (Pepcid) 20 mg twice daily.  Given the poor control of acid reflux despite omeprazole twice daily, and given the history of occasional dysphagia, I have recommended evaluation by gastroenterologist.   Return in about 4 months (around 11/17/2020), or if symptoms worsen or fail to improve.

## 2020-07-17 NOTE — Assessment & Plan Note (Signed)
Well-controlled, we will stepdown therapy at this time.  A prescription has been provided for Symbicort (budesonide/formoterol) 80/4.5 g, 2 inhalations twice a day.  If lower respiratory symptoms progress in frequency and/or severity, the patient is to resume the previous dose.  To maximize pulmonary deposition, a spacer has been provided along with instructions for its proper administration with an HFA inhaler.  Continue montelukast 10 mg daily at bedtime.  Continue albuterol HFA, 1 to 2 inhalations every 4-6 hours if needed.  Subjective and objective measures of pulmonary function will be followed and the treatment plan will be adjusted accordingly.

## 2020-07-17 NOTE — Assessment & Plan Note (Signed)
Currently with suboptimal control.  Appropriate reflux lifestyle modifications have been provided in written form.  For now, continue omeprazole 20 mg twice daily as prescribed.  Add famotidine (Pepcid) 20 mg twice daily.  Given the poor control of acid reflux despite omeprazole twice daily, and given the history of occasional dysphagia, I have recommended evaluation by gastroenterologist.

## 2020-07-25 ENCOUNTER — Telehealth: Payer: Self-pay

## 2020-07-25 NOTE — Telephone Encounter (Signed)
Prior authorization for Christine Wilkerson has been submitted via covermymeds. Status is currently pending approval/denial.

## 2020-07-26 NOTE — Telephone Encounter (Signed)
Pending

## 2020-07-30 ENCOUNTER — Other Ambulatory Visit: Payer: Self-pay

## 2020-07-30 MED ORDER — QNASL 80 MCG/ACT NA AERS: 1.0000 | INHALATION_SPRAY | Freq: Two times a day (BID) | NASAL | 5 refills | Status: DC | PRN

## 2020-07-30 NOTE — Telephone Encounter (Signed)
Patient would prefer a cheaper alternative.

## 2020-07-30 NOTE — Telephone Encounter (Signed)
Denial received. Patient can received medication at a cost of 50.00/bottle. We can also switch her if the cost is too high.

## 2020-07-30 NOTE — Telephone Encounter (Signed)
Informed pt of sending in qnasal

## 2020-07-30 NOTE — Telephone Encounter (Signed)
Qnasl, 1 puff per nostril twice daily as needed. Thanks.

## 2020-07-30 NOTE — Telephone Encounter (Signed)
Alternatives: Beconase, Patanase, Nasonex, Qnasl. Please advise on choice and thank you.

## 2020-07-30 NOTE — Telephone Encounter (Signed)
Sent in qnasl will call pt and inform her

## 2020-07-31 ENCOUNTER — Telehealth: Payer: Self-pay

## 2020-07-31 NOTE — Telephone Encounter (Signed)
Pa submitted for qnasal thru cover my meds

## 2020-08-02 NOTE — Telephone Encounter (Signed)
Received fax stating Qnasl PA denied. States patient must try and fail 2 alternative medications. Please advise.

## 2020-08-05 MED ORDER — MOMETASONE FUROATE 50 MCG/ACT NA SUSP: NASAL | 5 refills | Status: DC

## 2020-08-05 NOTE — Telephone Encounter (Signed)
Prescription sent

## 2020-08-05 NOTE — Telephone Encounter (Signed)
Mometasone, 2 sp per nostril daily as needed. Thanks.

## 2020-08-05 NOTE — Addendum Note (Signed)
Addended by: Ander Purpura R on: 08/05/2020 04:10 PM   Modules accepted: Orders

## 2020-09-06 ENCOUNTER — Telehealth: Payer: Self-pay | Admitting: Allergy and Immunology

## 2020-09-06 MED ORDER — BUDESONIDE-FORMOTEROL FUMARATE 160-4.5 MCG/ACT IN AERO
2.0000 | INHALATION_SPRAY | Freq: Two times a day (BID) | RESPIRATORY_TRACT | 5 refills | Status: DC
Start: 2020-09-06 — End: 2022-08-31

## 2020-09-06 MED ORDER — SPIRIVA RESPIMAT 1.25 MCG/ACT IN AERS
2.0000 | INHALATION_SPRAY | Freq: Every day | RESPIRATORY_TRACT | 5 refills | Status: DC
Start: 2020-09-06 — End: 2022-08-31

## 2020-09-06 NOTE — Telephone Encounter (Signed)
Patient reports that she was disgnosed with covid on 08/31/2020. She reports that she had been feeling good until yesterday when she developed symptoms including shortness of breath and dry cough.  She reports that she developed a slight wheeze that began this afternoon.  She has at home oximeter and reports the lowest hers oxygenation saturation has been is 97% over the last few days.  She denies fever.  She continues Symbicort 80-2 puffs twice a day with a spacer, montelukast 10 mg once a day, and has been using albuterol several times a day.  She agrees to stop Symbicort 80 and begin Symbicort 162 puffs twice a day with a spacer, begin Spiriva 1.25 mcg - 2 puffs once a day, continue montelukast 10 mg once a day, and use albuterol nebulizer treatments once every 4 hours as needed for cough or wheeze.  She agrees to use albuterol nebulizer treatments 20 minutes before using Symbicort and Spiriva.  She will call the clinic or 911 for respiratory distress, increasing fever, or worsening symptoms.  She will follow up in person in the clinic in 2 weeks or sooner if needed.

## 2020-09-06 NOTE — Telephone Encounter (Signed)
Can you please order Symbicort 160-2 puffs twice a day and Spiriva 1.25 mcg-2 puffs once a day for this patient. Thank you

## 2020-09-06 NOTE — Telephone Encounter (Signed)
MEDS SENT TO [facility]- PT INFORMED

## 2020-09-06 NOTE — Telephone Encounter (Signed)
She has not had any covid vaccines at this time.

## 2020-09-06 NOTE — Telephone Encounter (Signed)
PT called to speak to Dr Nunzio Cobbs. She tested positive for covid last saturday 9/11. Everything was going okay until yesterday she started having sob, bad cough, wheezing. Using albuterol nebulizer which semi-relieves her symptoms for a couple of hours. Taking allegra in am, singulair in pm, symbicort am/pm, omeprazole am/pm.   Byrd Hesselbach [phone]

## 2020-09-06 NOTE — Telephone Encounter (Signed)
Please read communication from [facility] regarding patient.

## 2020-09-09 ENCOUNTER — Telehealth: Payer: Self-pay

## 2020-09-09 NOTE — Telephone Encounter (Signed)
Called pt. No answer. Left message to call office. 

## 2020-09-09 NOTE — Telephone Encounter (Signed)
-----   Message from Hetty Blend, FNP sent at 09/09/2020  8:31 AM EDT ----- Can you please call to check on how this patient is doing. She was COVID positive with some shortness of breath and I changed her medications on Friday. Please ask about her breathing. Please ask if she received the monoclonal antibody infusion. Thank you

## 2020-09-09 NOTE — Telephone Encounter (Signed)
Late entry for 09/06/2020- Patient was given the phone number for monoclonal antibody evaluation. [phone]

## 2020-09-09 NOTE — Telephone Encounter (Signed)
I called check in. She is doing better while on the spiriva and symbicort 160. She is also taking vitamins and staying well hydrated.

## 2020-09-16 ENCOUNTER — Encounter: Payer: Self-pay | Admitting: Family Medicine

## 2020-09-16 ENCOUNTER — Ambulatory Visit: Payer: BC Managed Care – PPO | Admitting: Family Medicine

## 2020-09-16 ENCOUNTER — Other Ambulatory Visit: Payer: Self-pay

## 2020-09-16 VITALS — BP 130/90 | HR 81 | Temp 98.5°F | Resp 18 | Ht 64.0 in | Wt 231.0 lb

## 2020-09-16 DIAGNOSIS — H6983 Other specified disorders of Eustachian tube, bilateral: Secondary | ICD-10-CM | POA: Diagnosis not present

## 2020-09-16 DIAGNOSIS — J3089 Other allergic rhinitis: Secondary | ICD-10-CM | POA: Diagnosis not present

## 2020-09-16 DIAGNOSIS — K219 Gastro-esophageal reflux disease without esophagitis: Secondary | ICD-10-CM

## 2020-09-16 DIAGNOSIS — J4540 Moderate persistent asthma, uncomplicated: Secondary | ICD-10-CM

## 2020-09-16 MED ORDER — ALBUTEROL SULFATE HFA 108 (90 BASE) MCG/ACT IN AERS: INHALATION_SPRAY | RESPIRATORY_TRACT | 1 refills | Status: AC

## 2020-09-16 NOTE — Progress Notes (Addendum)
[address] [place] [address] Dept: [phone]  FOLLOW UP NOTE  Patient ID: Christine Wilkerson, female    DOB: [birth year]  Age: 29 y.o. MRN: [id number] Date of Office Visit: 09/16/2020  Assessment  Chief Complaint: Asthma  HPI Christine Wilkerson is a 29 year old female who presents to the clinic for follow-up visit.  She was last seen in this clinic 07/17/2020 by Dr. Nunzio Cobbs for evaluation of asthma, allergic rhinitis, eustachian tube dysfunction, and reflux.  In the interim she had a positive Covid test on August 31, 2020 with symptoms consistent with asthma flare for which she was started on Symbicort 160 and Spiriva 1.25 mics 2 puffs once a day in addition to her montelukast, albuterol, and omeprazole.  At today's visit, she reports her asthma as better but not back to baseline.  She reports symptoms including shortness of breath with moderate activity including housework and walking at the store, wheezing intermittently, and occasional cough a couple of times last week, however, the cough is improving.  She continues montelukast 10 mg once a day, Spiriva 1.25 mics-2 puffs once a day, and Symbicort 160-2 puffs via spacer twice a day.  She reports she has not needed her albuterol in 1 week.  Allergic rhinitis is reported as moderately well controlled with symptoms including nasal congestion, sneeze, and thick postnasal drainage.  She continues Flonase about once a week and nasal saline rinses about 1-2 times a week.  She does report that she normally has more nasal symptoms this time of year.  She has used [facility] with significant relief of symptoms in the past, however, her insurance has denied this medication.  She does report fullness in both ears and takes Mucinex only if she is unable to tolerate the ear fullness.  Reflux is reported as moderately well controlled with symptoms which are not worsening but continue to occur daily.  She continues omeprazole 20 mg once a day and  famotidine 20 mg as needed.  Her current medications are listed in the chart.  Drug Allergies:  Allergies  Allergen Reactions  . Amoxicillin Rash  . Topamax [Topiramate] Other (See Comments)    "feeling tired, not myself"    Physical Exam: BP 130/90   Pulse 81   Temp 98.5 F (36.9 C) (Tympanic)   Resp 18   Ht 5\' 4"  (1.626 m)   Wt 231 lb (104.8 kg)   SpO2 99%   BMI 39.65 kg/m    Physical Exam Vitals reviewed.  Constitutional:      Appearance: Normal appearance.  HENT:     Head: Normocephalic and atraumatic.     Right Ear: Tympanic membrane normal.     Left Ear: Tympanic membrane normal.     Nose:     Comments: Bilateral nares edematous and pale with clear nasal drainage noted.  Pharynx normal.  Ears normal.  Eyes normal.    Mouth/Throat:     Pharynx: Oropharynx is clear.  Eyes:     Conjunctiva/sclera: Conjunctivae normal.  Cardiovascular:     Rate and Rhythm: Normal rate and regular rhythm.     Heart sounds: No murmur heard.   Pulmonary:     Effort: Pulmonary effort is normal.     Breath sounds: Normal breath sounds.     Comments: Lungs clear to auscultation Musculoskeletal:        General: Normal range of motion.     Cervical back: Normal range of motion and neck supple.  Skin:  General: Skin is warm and dry.  Neurological:     Mental Status: She is alert and oriented to person, place, and time.  Psychiatric:        Mood and Affect: Mood normal.        Behavior: Behavior normal.        Thought Content: Thought content normal.        Judgment: Judgment normal.     Diagnostics: FVC 4.13, FEV1 3.85.  Predicted FVC 3.81, predicted FEV1 3.25.  Spirometry indicates normal ventilatory function.  Assessment and Plan: 1. Moderate persistent asthma, unspecified whether complicated   2. Allergic rhinitis   3. Gastroesophageal reflux disease, unspecified whether esophagitis present   4. Dysfunction of both eustachian tubes     Meds ordered this encounter    Medications  . albuterol (VENTOLIN HFA) 108 (90 Base) MCG/ACT inhaler    Sig: 2 PUFFS EVERY 4 HOURS AS NEEDED FOR COUGHING OR WHEEZING SPELLS    Dispense:  18 g    Refill:  1    Patient Instructions  Asthma Continue montelukast 10 mg once a day to prevent cough or wheeze Continue Symbicort 160-2 puffs twice a day with a spacer to prevent cough or wheeze Continue Symbicort 1.25 mcg - 2 puffs once a day to prevent cough or wheeze Continue albuterol 2 puffs every 4 hours as needed for cough or wheeze OR Instead use albuterol 0.083% solution via nebulizer one unit vial every 4 hours as needed for cough or wheeze  Allergic rhinitis Continue Xhance 2 sprays in each nostril twice a day as needed for nasal symptoms. If Timmothy Sours is denied by your insurance, begin Flonase 2 sprays in each nostril once a day for a stuffy nose.  In the right nostril, point the applicator out toward the right ear. In the left nostril, point the applicator out toward the left ear Continue cetirizine 10 mg once a day as needed for runny nose Consider saline nasal rinses as needed for nasal symptoms. Use this before any medicated nasal sprays for best result For thick nasal drainage or ear fullness, begin Mucinex 1200 mg twice a day as needed  Eustachian tube dysfunction Continue the treatment plan as outlined under allergic rhinitis  Reflux Recommend referral to gastroenterologist specialty for evaluation and treatment of reflux.  Continue omeprazole 20 mg once a day for reflux Continue famotidine 20 mg twice a day for control of reflux  Continue dietary and lifestyle modifications as listed below  Call the clinic if this treatment plan is not working well for you  Follow up in 2 months or sooner if needed.   Return in about 2 months (around 11/16/2020), or if symptoms worsen or fail to improve.    Thank you for the opportunity to care for this patient.  Please do not hesitate to contact me with  questions.  Thermon Leyland, FNP [facility]  ________________________________________________  I have provided oversight concerning [facility]'s evaluation and treatment of this patient's health issues addressed during today's encounter.  I agree with the assessment and therapeutic plan as outlined in the note.   Signed,   R Jorene Guest, MD

## 2020-09-16 NOTE — Patient Instructions (Addendum)
Asthma Continue montelukast 10 mg once a day to prevent cough or wheeze Continue Symbicort 160-2 puffs twice a day with a spacer to prevent cough or wheeze Continue Symbicort 1.25 mcg - 2 puffs once a day to prevent cough or wheeze Continue albuterol 2 puffs every 4 hours as needed for cough or wheeze OR Instead use albuterol 0.083% solution via nebulizer one unit vial every 4 hours as needed for cough or wheeze  Allergic rhinitis Continue XHANCE 2 sprays in each nostril twice a day as needed for nasal symptoms. If Timmothy Sours is denied by your insurance, begin Flonase 2 sprays in each nostril once a day for a stuffy nose.  In the right nostril, point the applicator out toward the right ear. In the left nostril, point the applicator out toward the left ear Continue cetirizine 10 mg once a day as needed for runny nose Consider saline nasal rinses as needed for nasal symptoms. Use this before any medicated nasal sprays for best result For thick nasal drainage or ear fullness, begin Mucinex 1200 mg twice a day as needed  Eustachian tube dysfunction Continue the treatment plan as outlined under allergic rhinitis  Reflux Recommend referral to gastroenterologist specialty for evaluation and treatment of reflux.  Continue omeprazole 20 mg once a day for reflux Continue famotidine 20 mg twice a day for control of reflux  Continue dietary and lifestyle modifications as listed below  Call the clinic if this treatment plan is not working well for you  Follow up in 2 months or sooner if needed.   Lifestyle Changes for Controlling GERD When you have GERD, stomach acid feels as if it's backing up toward your mouth. Whether or not you take medication to control your GERD, your symptoms can often be improved with lifestyle changes.   Raise Your Head  Reflux is more likely to strike when you're lying down flat, because stomach fluid can  flow backward more easily. Raising the head of your bed 4-6 inches  can help. To do this:  Slide blocks or books under the legs at the head of your bed. Or, place a wedge under  the mattress. Many foam stores can make a suitable wedge for you. The wedge  should run from your waist to the top of your head.  Don't just prop your head on several pillows. This increases pressure on your  stomach. It can make GERD worse.  Watch Your Eating Habits Certain foods may increase the acid in your stomach or relax the lower esophageal sphincter, making GERD more likely. It's best to avoid the following:  Coffee, tea, and carbonated drinks (with and without caffeine)  Fatty, fried, or spicy food  Mint, chocolate, onions, and tomatoes  Any other foods that seem to irritate your stomach or cause you pain  Relieve the Pressure  Eat smaller meals, even if you have to eat more often.  Don't lie down right after you eat. Wait a few hours for your stomach to empty.  Avoid tight belts and tight-fitting clothes.  Lose excess weight.  Tobacco and Alcohol  Avoid smoking tobacco and drinking alcohol. They can make GERD symptoms worse.

## 2020-09-17 ENCOUNTER — Other Ambulatory Visit: Payer: Self-pay

## 2020-09-17 MED ORDER — XHANCE 93 MCG/ACT NA EXHU: 2.0000 | INHALANT_SUSPENSION | Freq: Two times a day (BID) | NASAL | 12 refills | Status: DC | PRN

## 2020-11-10 ENCOUNTER — Other Ambulatory Visit: Payer: Self-pay | Admitting: Pediatrics

## 2020-11-18 ENCOUNTER — Ambulatory Visit: Payer: BC Managed Care – PPO | Admitting: Family Medicine

## 2020-11-26 ENCOUNTER — Ambulatory Visit: Payer: BC Managed Care – PPO | Admitting: Allergy and Immunology

## 2020-11-26 ENCOUNTER — Other Ambulatory Visit: Payer: Self-pay

## 2020-11-26 ENCOUNTER — Encounter: Payer: Self-pay | Admitting: Allergy and Immunology

## 2020-11-26 VITALS — BP 120/66 | HR 98 | Temp 98.3°F | Resp 16

## 2020-11-26 DIAGNOSIS — J4540 Moderate persistent asthma, uncomplicated: Secondary | ICD-10-CM

## 2020-11-26 DIAGNOSIS — K219 Gastro-esophageal reflux disease without esophagitis: Secondary | ICD-10-CM

## 2020-11-26 DIAGNOSIS — Z23 Encounter for immunization: Secondary | ICD-10-CM

## 2020-11-26 DIAGNOSIS — J3089 Other allergic rhinitis: Secondary | ICD-10-CM

## 2020-11-26 MED ORDER — PULMICORT FLEXHALER 180 MCG/ACT IN AEPB
2.0000 | INHALATION_SPRAY | Freq: Two times a day (BID) | RESPIRATORY_TRACT | 5 refills | Status: DC
Start: 2020-11-26 — End: 2022-08-31

## 2020-11-26 MED ORDER — AZELASTINE HCL 0.1 % NA SOLN: NASAL | 5 refills | Status: DC

## 2020-11-26 NOTE — Assessment & Plan Note (Signed)
   Continue appropriate reflux lifestyle modifications.  For now, continue omeprazole 20 mg twice daily as prescribed.  Continue famotidine (Pepcid) 20 mg twice daily if needed.

## 2020-11-26 NOTE — Assessment & Plan Note (Signed)
   Continue appropriate allergen avoidance measures.  Fluticasone nasal spray, 1 to 2 sprays per nostril daily as needed.  A prescription has been provided for azelastine nasal spray, 1-2 sprays per nostril 2 times daily as needed.   Nasal saline lavage (NeilMed) has been recommended as needed and prior to medicated nasal sprays along with instructions for proper administration.  Continue cetirizine 10 mg daily if needed.  If allergen avoidance measures and medications fail to adequately relieve symptoms, aeroallergen immunotherapy will be considered.

## 2020-11-26 NOTE — Assessment & Plan Note (Signed)
Well-controlled, we will stepdown therapy at this time.  A prescription has been provided for Pulmicort 180, 2 inhalations twice daily.  If lower respiratory symptoms progress in frequency and/or severity, the patient is to resume Symbicort 2 inhalations twice a day.   For now, continue montelukast 10 mg daily at bedtime.  Continue albuterol HFA, 1 to 2 inhalations every 4-6 hours if needed.  Subjective and objective measures of pulmonary function will be followed and the treatment plan will be adjusted accordingly.

## 2020-11-26 NOTE — Patient Instructions (Addendum)
Moderate persistent asthma Well-controlled, we will stepdown therapy at this time.  A prescription has been provided for Pulmicort 180, 2 inhalations twice daily.  If lower respiratory symptoms progress in frequency and/or severity, the patient is to resume Symbicort 2 inhalations twice a day.   For now, continue montelukast 10 mg daily at bedtime.  Continue albuterol HFA, 1 to 2 inhalations every 4-6 hours if needed.  Subjective and objective measures of pulmonary function will be followed and the treatment plan will be adjusted accordingly.  Allergic rhinitis  Continue appropriate allergen avoidance measures.  Fluticasone nasal spray, 1 to 2 sprays per nostril daily as needed.  A prescription has been provided for azelastine nasal spray, 1-2 sprays per nostril 2 times daily as needed.   Nasal saline lavage (NeilMed) has been recommended as needed and prior to medicated nasal sprays along with instructions for proper administration.  Continue cetirizine 10 mg daily if needed.  If allergen avoidance measures and medications fail to adequately relieve symptoms, aeroallergen immunotherapy will be considered.  GERD (gastroesophageal reflux disease)  Continue appropriate reflux lifestyle modifications.  For now, continue omeprazole 20 mg twice daily as prescribed.  Continue famotidine (Pepcid) 20 mg twice daily if needed.   Return in about 5 months (around 04/26/2021), or if symptoms worsen or fail to improve.

## 2020-11-26 NOTE — Progress Notes (Signed)
Follow-up Note  RE: Christine Wilkerson MRN: [id number] DOB: 07-24-1991 Date of Office Visit: 11/26/2020  Primary care provider: [facility] Referring provider: [facility]  History of present illness: Christine Wilkerson is a 29 y.o. female with persistent asthma and allergic rhinitis presenting today for follow-up.  She was last seen in this clinic on September 16, 2020.  She reports that in the interval since her previous visit her asthma has been well controlled.  She rarely requires albuterol rescue and denies limitations in normal daily activities or nocturnal awakenings due to lower respiratory symptoms.  She is currently taking Symbicort 2 inhalations via spacer device twice daily.  She comments that she is trying to get pregnant and is curious as to which inhaler she would use should she become pregnant. Christine Wilkerson reports that her nasal allergy symptoms are moderately well controlled with fluticasone nasal spray, 2 sprays per nostril twice daily.  She reports that Christine Wilkerson had been significantly more effective, however insurance stopped paying for this medication.  She reports that her acid reflux is well controlled currently.  Assessment and plan: Moderate persistent asthma Well-controlled, we will stepdown therapy at this time.  A prescription has been provided for Pulmicort 180, 2 inhalations twice daily.  If lower respiratory symptoms progress in frequency and/or severity, the patient is to resume Symbicort 2 inhalations twice a day.   For now, continue montelukast 10 mg daily at bedtime.  Continue albuterol HFA, 1 to 2 inhalations every 4-6 hours if needed.  Subjective and objective measures of pulmonary function will be followed and the treatment plan will be adjusted accordingly.  Allergic rhinitis  Continue appropriate allergen avoidance measures.  Fluticasone nasal spray, 1 to 2 sprays per nostril daily as needed.  A prescription  has been provided for azelastine nasal spray, 1-2 sprays per nostril 2 times daily as needed.   Nasal saline lavage (NeilMed) has been recommended as needed and prior to medicated nasal sprays along with instructions for proper administration.  Continue cetirizine 10 mg daily if needed.  If allergen avoidance measures and medications fail to adequately relieve symptoms, aeroallergen immunotherapy will be considered.  GERD (gastroesophageal reflux disease)  Continue appropriate reflux lifestyle modifications.  For now, continue omeprazole 20 mg twice daily as prescribed.  Continue famotidine (Pepcid) 20 mg twice daily if needed.   Meds ordered this encounter  Medications  . budesonide (PULMICORT FLEXHALER) 180 MCG/ACT inhaler    Sig: Inhale 2 puffs into the lungs 2 (two) times daily.    Dispense:  1 each    Refill:  5  . azelastine (ASTELIN) 0.1 % nasal spray    Sig: 1-2 sprayc each nostril bid as needed    Dispense:  30 mL    Refill:  5    Diagnostics: Due to Covid precautions, spirometry was not performed today as the patient's symptoms are reported as well controlled and pulmonary exam was normal.    Physical examination: Blood pressure 120/66, pulse 98, temperature 98.3 F (36.8 C), temperature source Temporal, resp. rate 16, SpO2 99 %.  General: Alert, interactive, in no acute distress. HEENT: TMs pearly gray, turbinates mildly edematous without discharge, post-pharynx unremarkable. Neck: Supple without lymphadenopathy. Lungs: Clear to auscultation without wheezing, rhonchi or rales. CV: Normal S1, S2 without murmurs. Skin: Warm and dry, without lesions or rashes.  The following portions of the patient's history were reviewed and updated as appropriate: allergies, current medications, past family history, past medical history,  past social history, past surgical history and problem list.  Current Outpatient Medications  Medication Sig Dispense Refill  . albuterol  (PROVENTIL) (2.5 MG/3ML) 0.083% nebulizer solution Use 1 unit dose every 4 hours as needed for coughing or wheezing spells 75 mL 1  . albuterol (VENTOLIN HFA) 108 (90 Base) MCG/ACT inhaler 2 PUFFS EVERY 4 HOURS AS NEEDED FOR COUGHING OR WHEEZING SPELLS 18 g 1  . Ascorbic Acid (VITAMIN C) 500 MG CHEW Chew by mouth.    . budesonide-formoterol (SYMBICORT) 160-4.5 MCG/ACT inhaler Inhale 2 puffs into the lungs 2 (two) times daily. 1 each 5  . Cranberry 500 MG TABS Take 500 mg by mouth at bedtime.    . famotidine (PEPCID) 20 MG tablet Take 1 tablet (20 mg total) by mouth 2 (two) times daily. 60 tablet 5  . fluticasone (FLONASE) 50 MCG/ACT nasal spray Place 1 spray into both nostrils at bedtime.    . Melatonin-Pyridoxine (MELATIN PO) Take by mouth.    Marland Kitchen omeprazole (PRILOSEC) 20 MG capsule ONE CAPSULE TWICE A DAY FOR REFLUX 180 capsule 0  . Prenatal Vit-DSS-Fe Cbn-FA (PRENATAL AD PO) Take by mouth.    Marland Kitchen SINGULAIR 10 MG tablet at bedtime.    Marland Kitchen azelastine (ASTELIN) 0.1 % nasal spray 1-2 sprayc each nostril bid as needed 30 mL 5  . budesonide (PULMICORT FLEXHALER) 180 MCG/ACT inhaler Inhale 2 puffs into the lungs 2 (two) times daily. 1 each 5  . Tiotropium Bromide Monohydrate (SPIRIVA RESPIMAT) 1.25 MCG/ACT AERS Inhale 2 puffs into the lungs daily. (Patient not taking: Reported on 11/26/2020) 4 g 5   No current facility-administered medications for this visit.    Allergies  Allergen Reactions  . Amoxicillin Rash  . Topamax [Topiramate] Other (See Comments)    "feeling tired, not myself"    I appreciate the opportunity to take part in Christine Wilkerson's care. Please do not hesitate to contact me with questions.  Sincerely,   R. Jorene Guest, MD

## 2020-11-27 ENCOUNTER — Telehealth: Payer: Self-pay

## 2020-11-27 NOTE — Telephone Encounter (Signed)
Pts insurance will not cover Pulmicort flex haler pt will have to fail either asmanex twist haler, Flovent or kvar redihaler please advise

## 2020-11-27 NOTE — Telephone Encounter (Signed)
The pulmicort is because she is planning on getting pregnant. Can we write a PA as Pulmicort is recommended over the other inhalers for pregnancy? Thanks.

## 2020-11-28 NOTE — Telephone Encounter (Signed)
Pa submitted thru cover my meds 

## 2020-12-02 NOTE — Telephone Encounter (Signed)
Pulmicort Flexhaler  180 mcg approved  11/28/20 thru 11/27/21

## 2021-02-13 ENCOUNTER — Other Ambulatory Visit: Payer: Self-pay | Admitting: Family Medicine

## 2021-02-20 ENCOUNTER — Other Ambulatory Visit: Payer: Self-pay

## 2021-02-20 ENCOUNTER — Telehealth: Payer: Self-pay | Admitting: Allergy & Immunology

## 2021-02-20 MED ORDER — SINGULAIR 10 MG PO TABS: 10.0000 mg | ORAL_TABLET | Freq: Every day | ORAL | 1 refills | Status: DC

## 2021-02-20 NOTE — Telephone Encounter (Signed)
Pt is not due until may of this year for a follow up so sent in enough to get her until may when she is due for the follow up

## 2021-02-20 NOTE — Telephone Encounter (Signed)
Patient asking for a curtsy refill of SINGULAIR 10 MG tablet please advise

## 2021-03-18 ENCOUNTER — Other Ambulatory Visit: Payer: Self-pay | Admitting: Family Medicine

## 2021-04-15 ENCOUNTER — Other Ambulatory Visit: Payer: Self-pay | Admitting: Family Medicine

## 2021-05-13 ENCOUNTER — Other Ambulatory Visit: Payer: Self-pay | Admitting: Family Medicine

## 2021-06-14 ENCOUNTER — Other Ambulatory Visit: Payer: Self-pay | Admitting: Family Medicine

## 2021-08-17 ENCOUNTER — Other Ambulatory Visit: Payer: Self-pay | Admitting: Family Medicine

## 2022-02-12 LAB — HEPATITIS C ANTIBODY: HCV Ab: NEGATIVE

## 2022-02-12 LAB — OB RESULTS CONSOLE HEPATITIS B SURFACE ANTIGEN: Hepatitis B Surface Ag: NEGATIVE

## 2022-02-12 LAB — OB RESULTS CONSOLE RUBELLA ANTIBODY, IGM: Rubella: IMMUNE

## 2022-02-12 LAB — OB RESULTS CONSOLE GC/CHLAMYDIA
Chlamydia: NEGATIVE
Neisseria Gonorrhea: NEGATIVE

## 2022-02-12 LAB — OB RESULTS CONSOLE HIV ANTIBODY (ROUTINE TESTING): HIV: NONREACTIVE

## 2022-02-12 LAB — OB RESULTS CONSOLE ANTIBODY SCREEN: Antibody Screen: NEGATIVE

## 2022-02-12 LAB — OB RESULTS CONSOLE ABO/RH: RH Type: POSITIVE

## 2022-02-12 LAB — OB RESULTS CONSOLE RPR: RPR: NONREACTIVE

## 2022-08-11 LAB — OB RESULTS CONSOLE GBS: GBS: NEGATIVE

## 2022-08-20 ENCOUNTER — Encounter (HOSPITAL_COMMUNITY): Payer: Self-pay | Admitting: *Deleted

## 2022-08-20 NOTE — Patient Instructions (Addendum)
Christine Wilkerson  08/20/2022   Your procedure is scheduled on:  09/03/2022  Arrive at 0800 at [employer] on [address] at [facility]  and [address]. You are invited to use the FREE valet parking or use the Visitor's parking deck.  Pick up the phone at the desk and dial [phone].  Call this number if you have problems the morning of surgery: [phone]  Remember:   Do not eat food:(After Midnight) Desps de medianoche.  Do not drink clear liquids: (After Midnight) Desps de medianoche.  Take these medicines the morning of surgery with A SIP OF WATER:  Bring your inhaler with you take omeprazole as prescribed   Do not wear jewelry, make-up or nail polish.  Do not wear lotions, powders, or perfumes. Do not wear deodorant.  Do not shave 48 hours prior to surgery.  Do not bring valuables to the hospital.  [facility] is not   responsible for any belongings or valuables brought to the hospital.  Contacts, dentures or bridgework may not be worn into surgery.  Leave suitcase in the car. After surgery it may be brought to your room.  For patients admitted to the hospital, checkout time is 11:00 AM the day of              discharge.      Please read over the following fact sheets that you were given:     Preparing for Surgery

## 2022-08-21 ENCOUNTER — Encounter (HOSPITAL_COMMUNITY): Payer: Self-pay

## 2022-08-30 ENCOUNTER — Encounter (HOSPITAL_COMMUNITY): Payer: Self-pay | Admitting: Obstetrics and Gynecology

## 2022-08-30 ENCOUNTER — Inpatient Hospital Stay (HOSPITAL_COMMUNITY)
Admission: AD | Admit: 2022-08-30 | Discharge: 2022-08-30 | Disposition: A | Discharge status: Home / Self Care | Payer: BC Managed Care – PPO | Attending: Obstetrics and Gynecology | Admitting: Obstetrics and Gynecology

## 2022-08-30 ENCOUNTER — Other Ambulatory Visit: Payer: Self-pay

## 2022-08-30 DIAGNOSIS — Z3A38 38 weeks gestation of pregnancy: Secondary | ICD-10-CM | POA: Diagnosis not present

## 2022-08-30 DIAGNOSIS — W19XXXA Unspecified fall, initial encounter: Secondary | ICD-10-CM

## 2022-08-30 DIAGNOSIS — O368130 Decreased fetal movements, third trimester, not applicable or unspecified: Secondary | ICD-10-CM | POA: Diagnosis not present

## 2022-08-30 DIAGNOSIS — W010XXA Fall on same level from slipping, tripping and stumbling without subsequent striking against object, initial encounter: Secondary | ICD-10-CM | POA: Insufficient documentation

## 2022-08-30 DIAGNOSIS — Z3493 Encounter for supervision of normal pregnancy, unspecified, third trimester: Secondary | ICD-10-CM

## 2022-08-30 HISTORY — DX: Gastro-esophageal reflux disease without esophagitis: K21.9

## 2022-08-30 LAB — PROTEIN / CREATININE RATIO, URINE
Creatinine, Urine: 88 mg/dL
Protein Creatinine Ratio: 0.08 mg/mg{Cre} (ref 0.00–0.15)
Total Protein, Urine: 7 mg/dL

## 2022-08-30 LAB — COMPREHENSIVE METABOLIC PANEL
ALT: 12 U/L (ref 0–44)
AST: 20 U/L (ref 15–41)
Albumin: 2.5 g/dL — ABNORMAL LOW (ref 3.5–5.0)
Alkaline Phosphatase: 124 U/L (ref 38–126)
Anion gap: 9 (ref 5–15)
BUN: 5 mg/dL — ABNORMAL LOW (ref 6–20)
CO2: 21 mmol/L — ABNORMAL LOW (ref 22–32)
Calcium: 8.8 mg/dL — ABNORMAL LOW (ref 8.9–10.3)
Chloride: 109 mmol/L (ref 98–111)
Creatinine, Ser: 0.58 mg/dL (ref 0.44–1.00)
GFR, Estimated: >60 mL/min (ref >60)
Glucose, Bld: 95 mg/dL (ref 70–99)
Potassium: 3.8 mmol/L (ref 3.5–5.1)
Sodium: 139 mmol/L (ref 135–145)
Total Bilirubin: 0.2 mg/dL — ABNORMAL LOW (ref 0.3–1.2)
Total Protein: 5.4 g/dL — ABNORMAL LOW (ref 6.5–8.1)

## 2022-08-30 LAB — POCT FERN TEST: POCT Fern Test: NEGATIVE

## 2022-08-30 LAB — CBC
HCT: 38.3 % (ref 36.0–46.0)
Hemoglobin: 12.8 g/dL (ref 12.0–15.0)
MCH: 28 pg (ref 26.0–34.0)
MCHC: 33.4 g/dL (ref 30.0–36.0)
MCV: 83.8 fL (ref 80.0–100.0)
Platelets: 209 10*3/uL (ref 150–400)
RBC: 4.57 MIL/uL (ref 3.87–5.11)
RDW: 13.4 % (ref 11.5–15.5)
WBC: 8.4 10*3/uL (ref 4.0–10.5)
nRBC: 0 % (ref 0.0–0.2)

## 2022-08-30 MED ORDER — CYCLOBENZAPRINE HCL 5 MG PO TABS: 5.0000 mg | ORAL_TABLET | Freq: Three times a day (TID) | ORAL | 0 refills | Status: DC | PRN

## 2022-08-30 MED ORDER — CYCLOBENZAPRINE HCL 5 MG PO TABS
10.0000 mg | ORAL_TABLET | Freq: Once | ORAL | Status: AC
Start: 2022-08-30 — End: 2022-08-30
  Administered 2022-08-30: 10 mg via ORAL
  Filled 2022-08-30: qty 2

## 2022-08-30 MED ORDER — ACETAMINOPHEN 500 MG PO TABS
1000.0000 mg | ORAL_TABLET | Freq: Once | ORAL | Status: AC
  Administered 2022-08-30: 1000 mg via ORAL
  Filled 2022-08-30: qty 2

## 2022-08-30 NOTE — MAU Note (Signed)
Christine Wilkerson is a 31 y.o. at [redacted]w[redacted]d here in MAU reporting: a fall at 1426 today on some cleaning solution in her kitchen. Patient states she fell and landed on her right knee. Patient denies having any contractions, no vaginal bleeding, she does report having some LOF after she got up from the fall and decreased fetal movement. Patient arrived via EMS.  Onset of complaint: 1426 today Pain score: 6 in the right hip and 4 for headache pain   FHT:130

## 2022-08-30 NOTE — MAU Provider Note (Signed)
History     CSN: [id number]  Arrival date and time: 08/30/22 1544   Event Date/Time   First Provider Initiated Contact with Patient 08/30/22 1617      Chief Complaint  Patient presents with   Rupture of Membranes   Fall   Decreased Fetal Movement   Christine Wilkerson a  31 y.o. [id number] at [redacted]w[redacted]d presents to MAU via EMS after at fall at 1436. Patient states that she was cleaning in her home and slipped on some cleaning solution and fell. Patient states she landed on her knee and rolled to a sitting position and denies hitting her stomach. Patient denies contractions and vaginal bleeding. Endorses fetal movement since fall but states its "not as much". Patient states when she was picked up by EMS she noted some vaginal leaking. She denies vaginal bleeding. She denies headache, blurred vision but notes some occasional epigastric pain.   She also endorses some mild "knee pain" post fall and a headache that has been on-going for the last 2 hours. Currently rating knee pain a 6/10 and a headache 4/10.        Fall Associated symptoms include abdominal pain and headaches. Pertinent negatives include no fever, nausea or vomiting.    OB History     Gravida  6   Para  3   Term  3   Preterm      AB  2   Living  3      SAB  2   IAB      Ectopic      Multiple      Live Births              Past Medical History:  Diagnosis Date   Asthma    Complication of anesthesia    hypotension   GERD (gastroesophageal reflux disease)    Vertebral artery dissection (HCC)     Past Surgical History:  Procedure Laterality Date   CESAREAN SECTION     CESAREAN SECTION     TONSILLECTOMY      Family History  Problem Relation Age of Onset   Rheum arthritis Mother    Bell's palsy Mother    Osteoporosis Mother    Allergic rhinitis Mother    Asthma Mother    Diabetes Father    Neuropathy Sister        CRPS   Asthma Sister    Allergic rhinitis Sister    Asthma Sister     Allergic rhinitis Sister    Asthma Sister    Allergic rhinitis Sister    Asthma Sister    Allergic rhinitis Sister    Diabetes Brother    Crohn's disease Brother    Asthma Daughter    Allergic rhinitis Daughter    Autism Daughter    Asthma Son    Allergic rhinitis Son    Urticaria Son    Eczema Son    Food Allergy Son    Other Son        allergic to mosquitos   Migraines Neg Hx     Social History   Tobacco Use   Smoking status: Never   Smokeless tobacco: Never  Vaping Use   Vaping Use: Never used  Substance Use Topics   Alcohol use: No   Drug use: No    Allergies:  Allergies  Allergen Reactions   Amoxicillin Rash   Topamax [Topiramate] Other (See Comments)    "feeling tired, not myself"    No  medications prior to admission.    Review of Systems  Constitutional:  Negative for chills, fatigue and fever.  Eyes:  Negative for pain and visual disturbance.  Respiratory:  Negative for apnea, shortness of breath and wheezing.   Cardiovascular:  Negative for chest pain and palpitations.  Gastrointestinal:  Positive for abdominal pain. Negative for constipation, diarrhea, nausea and vomiting.  Genitourinary:  Positive for pelvic pain and vaginal discharge. Negative for difficulty urinating, dysuria, vaginal bleeding and vaginal pain.  Musculoskeletal:  Negative for back pain.  Neurological:  Positive for headaches. Negative for seizures and weakness.  Psychiatric/Behavioral:  Negative for suicidal ideas.    Physical Exam   Blood pressure 116/61, pulse 81, temperature 98 F (36.7 C), temperature source Oral, resp. rate 18, height 5\' 4"  (1.626 m), SpO2 98 %.  Physical Exam Vitals and nursing note reviewed. Exam conducted with a chaperone present.  Constitutional:      General: She is not in acute distress.    Appearance: Normal appearance.  HENT:     Head: Normocephalic.  Cardiovascular:     Rate and Rhythm: Normal rate.  Pulmonary:     Effort: Pulmonary  effort is normal.  Abdominal:     Palpations: Abdomen is soft.     Tenderness: There is abdominal tenderness in the right upper quadrant.  Genitourinary:    General: Normal vulva.     Vagina: Normal.     Cervix: Discharge present.  Musculoskeletal:        General: Normal range of motion.     Cervical back: Normal range of motion.     Right knee: Erythema present.     Left knee: Normal.     Comments: Non bleeding abrasion to Right Knee. Appears to be carpet burn.   Skin:    General: Skin is warm and dry.  Neurological:     Mental Status: She is alert and oriented to person, place, and time.  Psychiatric:        Mood and Affect: Mood normal.   FHT: 135 bpm with moderate variability  15x15 accels present  Decels Absent  Toco: UI with occasional contractions. Patient unaware of contractions.   Dilation: Closed Effacement (%): Thick Cervical Position: Posterior Station: -3 Exam by:: [email or web address] CNM  MAU Course  Procedures Orders Placed This Encounter  Procedures   CBC   Comprehensive metabolic panel   Protein / creatinine ratio, urine   Fern Test   Discharge patient   Meds ordered this encounter  Medications   cyclobenzaprine (FLEXERIL) tablet 10 mg   acetaminophen (TYLENOL) tablet 1,000 mg   cyclobenzaprine (FLEXERIL) 5 MG tablet    Sig: Take 1 tablet (5 mg total) by mouth 3 (three) times daily as needed for muscle spasms.    Dispense:  20 tablet    Refill:  0    Order Specific Question:   Supervising Provider    Answer:   Rhunette Croft   Results for orders placed or performed during the hospital encounter of 08/30/22 (from the past 24 hour(s))  Protein / creatinine ratio, urine     Status: None   Collection Time: 08/30/22  4:41 PM  Result Value Ref Range   Creatinine, Urine 88 mg/dL   Total Protein, Urine 7 mg/dL   Protein Creatinine Ratio 0.08 0.00 - 0.15 mg/mg[Cre]  Fern Test     Status: None   Collection Time: 08/30/22  5:00 PM  Result Value Ref  Range   POCT  Fern Test Negative = intact amniotic membranes   CBC     Status: None   Collection Time: 08/30/22  5:01 PM  Result Value Ref Range   WBC 8.4 4.0 - 10.5 K/uL   RBC 4.57 3.87 - 5.11 MIL/uL   Hemoglobin 12.8 12.0 - 15.0 g/dL   HCT 04.8 88.9 - 16.9 %   MCV 83.8 80.0 - 100.0 fL   MCH 28.0 26.0 - 34.0 pg   MCHC 33.4 30.0 - 36.0 g/dL   RDW 45.0 38.8 - 82.8 %   Platelets 209 150 - 400 K/uL   nRBC 0.0 0.0 - 0.2 %  Comprehensive metabolic panel     Status: Abnormal   Collection Time: 08/30/22  5:01 PM  Result Value Ref Range   Sodium 139 135 - 145 mmol/L   Potassium 3.8 3.5 - 5.1 mmol/L   Chloride 109 98 - 111 mmol/L   CO2 21 (L) 22 - 32 mmol/L   Glucose, Bld 95 70 - 99 mg/dL   BUN 5 (L) 6 - 20 mg/dL   Creatinine, Ser 0.03 0.44 - 1.00 mg/dL   Calcium 8.8 (L) 8.9 - 10.3 mg/dL   Total Protein 5.4 (L) 6.5 - 8.1 g/dL   Albumin 2.5 (L) 3.5 - 5.0 g/dL   AST 20 15 - 41 U/L   ALT 12 0 - 44 U/L   Alkaline Phosphatase 124 38 - 126 U/L   Total Bilirubin 0.2 (L) 0.3 - 1.2 mg/dL   GFR, Estimated >49 >17 mL/min   Anion gap 9 5 - 15   Initial fall at 1426pm. Time is now 1544pm. Plan to monitor for an additional 2 hours post fall.   MDM Lab results reviewed and interpreted by me.   (Reassessment @ 1744)- FHT Cat I. 135 bpm with moderate variability and 15x15 accels present. No decels noted.  Toco: Irritability has spontaneously resolved and occasional UC.  Patient remains unaware of ctx.   Crist Fat Negative x2  - Pre E labs normal  - Headache resolved with Tylenol  - Hip pain resolved with PO flexeril - Cervix closed  - FHT Cat I s/p 2 hours of monitoring  - Membranes intact  - Patient feeling frequent fetal movement  - Plan for discharge   Assessment and Plan   1. Fall, initial encounter   2. Intact amniotic membranes during pregnancy in third trimester   3. [redacted] weeks gestation of pregnancy    - Discussed that via patient report there was likely no abdominal trauma from  fall, with all the reassuring findings in MAU patient is stable to be discharged home.   - Patient reassured by MAU findings.  - Return precautions reviewed.  - Patient has a r/p C/s scheduled for Thursday 09/04/22. Encouraged to keep scheduled appointments and/or return to MAU if spontaneous onset of labor occurs.  - Term labor precautions reviewed.  - FHT Cat I upon discharge.  - Patient discharged home in stable condition and may return to MAU as needed.    Aspin Palomarez Danella Deis) Suzie Portela, MSN, CNM  [facility]  08/31/22 8:49 AM

## 2022-09-01 ENCOUNTER — Encounter (HOSPITAL_COMMUNITY)
Admission: RE | Admit: 2022-09-01 | Discharge: 2022-09-01 | Disposition: A | Discharge status: Home / Self Care | Payer: BC Managed Care – PPO | Source: Ambulatory Visit | Attending: Obstetrics and Gynecology | Admitting: Obstetrics and Gynecology

## 2022-09-01 ENCOUNTER — Encounter (HOSPITAL_COMMUNITY): Payer: Self-pay

## 2022-09-01 DIAGNOSIS — Z01812 Encounter for preprocedural laboratory examination: Secondary | ICD-10-CM | POA: Insufficient documentation

## 2022-09-01 DIAGNOSIS — Z98891 History of uterine scar from previous surgery: Secondary | ICD-10-CM | POA: Insufficient documentation

## 2022-09-01 HISTORY — DX: Other complications of anesthesia, initial encounter: T88.59XA

## 2022-09-01 HISTORY — DX: Dissection of vertebral artery: I77.74

## 2022-09-01 LAB — CBC WITH DIFFERENTIAL/PLATELET
Abs Immature Granulocytes: 0.05 10*3/uL (ref 0.00–0.07)
Basophils Absolute: 0 10*3/uL (ref 0.0–0.1)
Basophils Relative: 0 %
Eosinophils Absolute: 0 10*3/uL (ref 0.0–0.5)
Eosinophils Relative: 1 %
HCT: 41.6 % (ref 36.0–46.0)
Hemoglobin: 13.4 g/dL (ref 12.0–15.0)
Immature Granulocytes: 1 %
Lymphocytes Relative: 20 %
Lymphs Abs: 1.5 10*3/uL (ref 0.7–4.0)
MCH: 27.8 pg (ref 26.0–34.0)
MCHC: 32.2 g/dL (ref 30.0–36.0)
MCV: 86.3 fL (ref 80.0–100.0)
Monocytes Absolute: 0.6 10*3/uL (ref 0.1–1.0)
Monocytes Relative: 7 %
Neutro Abs: 5.3 10*3/uL (ref 1.7–7.7)
Neutrophils Relative %: 71 %
Platelets: 187 10*3/uL (ref 150–400)
RBC: 4.82 MIL/uL (ref 3.87–5.11)
RDW: 13.5 % (ref 11.5–15.5)
WBC: 7.4 10*3/uL (ref 4.0–10.5)
nRBC: 0 % (ref 0.0–0.2)

## 2022-09-01 LAB — TYPE AND SCREEN
ABO/RH(D): B POS
Antibody Screen: NEGATIVE

## 2022-09-01 LAB — RPR: RPR Ser Ql: NONREACTIVE

## 2022-09-02 MED ORDER — GENTAMICIN SULFATE 40 MG/ML IJ SOLN
5.0000 mg/kg | INTRAVENOUS | Status: AC
  Administered 2022-09-03: 390 mg via INTRAVENOUS
  Filled 2022-09-02: qty 9.75

## 2022-09-02 MED ORDER — METRONIDAZOLE 500 MG/100ML IV SOLN
500.0000 mg | INTRAVENOUS | Status: AC
  Administered 2022-09-03: 500 mg via INTRAVENOUS
  Filled 2022-09-02: qty 100

## 2022-09-02 NOTE — H&P (Signed)
Christine Wilkerson is a 31 y.o. female presenting for scheduled procedure. 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 Allergic to amoxicillin and topamax Growth Scan on 8/22 - 2475g/24.3%tile, vtx, AFI 16cm, rest WNL   OB History     Gravida  6   Para  3   Term  3   Preterm      AB  2   Living  3      SAB  2   IAB      Ectopic      Multiple      Live Births             Past Medical History:  Diagnosis Date   Asthma    Complication of anesthesia    hypotension   GERD (gastroesophageal reflux disease)    Vertebral artery dissection (HCC)    Past Surgical History:  Procedure Laterality Date   CESAREAN SECTION     CESAREAN SECTION     TONSILLECTOMY     Family History: family history includes Allergic rhinitis in her daughter, mother, sister, sister, sister, sister, and son; Asthma in her daughter, mother, sister, sister, sister, sister, and son; Autism in her daughter; Bell's palsy in her mother; Crohn's disease in her brother; Diabetes in her brother and father; Eczema in her son; Food Allergy in her son; Neuropathy in her sister; Osteoporosis in her mother; Other in her son; Rheum arthritis in her mother; Urticaria in her son. Social History:  reports that she has never smoked. She has never used smokeless tobacco. She reports that she does not drink alcohol and does not use drugs.     Maternal Diabetes: No 1hr 162, 3hr 0 of 4 Genetic Screening: Normal Maternal Ultrasounds/Referrals: Normal Fetal Ultrasounds or other Referrals:  None Maternal Substance Abuse:  No Significant Maternal Medications:  None Significant Maternal Lab Results:  Group B Strep negative Number of Prenatal Visits:greater than 3 verified prenatal visits Other Comments:  None  Review of Systems  Constitutional:  Negative for chills  and fever.  Respiratory:  Negative for shortness of breath.   Cardiovascular:  Negative for chest pain, palpitations and leg swelling.  Gastrointestinal:  Negative for abdominal pain and vomiting.  Neurological:  Negative for dizziness, weakness and headaches.  Psychiatric/Behavioral:  Negative for suicidal ideas.    History   There were no vitals taken for this visit. Exam Physical Exam Constitutional:      General: She is not in acute distress.    Appearance: She is well-developed.  HENT:     Head: Normocephalic and atraumatic.  Eyes:     Pupils: Pupils are equal, round, and reactive to light.  Cardiovascular:     Rate and Rhythm: Normal rate and regular rhythm.     Heart sounds: No murmur heard.    No gallop.  Abdominal:     Tenderness: There is no abdominal tenderness. There is no guarding or rebound.  Genitourinary:    Vagina: Normal.  Musculoskeletal:        General: Normal range of motion.     Cervical back: Normal range of motion and neck supple.  Skin:    General: Skin is warm and dry.  Neurological:     Mental Status: She is alert and oriented to person, place, and  time.     Prenatal labs: ABO, Rh: --/--/B POS (09/12 5284) Antibody: NEG (09/12 1324) Rubella: Immune (02/23 0000) RPR: NON REACTIVE (09/12 1000)  HBsAg: Negative (02/23 0000)  HIV: Non-reactive (02/23 0000)  GBS:   neg  Assessment/Plan: This is a 31yo [id number] @ 39 1/7 by LMP c/w 10wk TVUS admitted for [facility].  R/B/A of cesarean section discussed with patient. Alternative would be vaginal delivery which would mean shorter postpartum stay and decreased risk of bleeding. Risks of section include infection of the uterus, pelvic organs, or skin, inadvertent injury to internal organs, such as bowel or bladder. If there is major injury, extensive surgery may be required. If injury is minor, it may be treated with relative ease. Discussed possibility of excessive blood loss and transfusion. If bleeding  cannot be controlled using medical or minor surgical methods, a cesarean hysterectomy may be performed which would mean no future fertility. Patient accepts the possibility of blood transfusion, if necessary. Patient understands and agrees to move forward with section.    Valerie Roys Donevan Biller 09/02/2022, 4:28 PM

## 2022-09-03 ENCOUNTER — Encounter (HOSPITAL_COMMUNITY): Payer: Self-pay | Admitting: Obstetrics and Gynecology

## 2022-09-03 ENCOUNTER — Inpatient Hospital Stay (HOSPITAL_COMMUNITY)
Admission: AD | Admit: 2022-09-03 | Discharge: 2022-09-05 | DRG: 788 | Disposition: A | Discharge status: Home / Self Care | Payer: BC Managed Care – PPO | Attending: Obstetrics and Gynecology | Admitting: Obstetrics and Gynecology

## 2022-09-03 ENCOUNTER — Other Ambulatory Visit: Payer: Self-pay

## 2022-09-03 ENCOUNTER — Inpatient Hospital Stay (HOSPITAL_COMMUNITY): Payer: BC Managed Care – PPO | Admitting: Anesthesiology

## 2022-09-03 ENCOUNTER — Encounter (HOSPITAL_COMMUNITY)
Admission: AD | Disposition: A | Discharge status: Home / Self Care | Payer: Self-pay | Source: Home / Self Care | Attending: Obstetrics and Gynecology

## 2022-09-03 DIAGNOSIS — J45909 Unspecified asthma, uncomplicated: Secondary | ICD-10-CM | POA: Diagnosis present

## 2022-09-03 DIAGNOSIS — Z880 Allergy status to penicillin: Secondary | ICD-10-CM

## 2022-09-03 DIAGNOSIS — O9952 Diseases of the respiratory system complicating childbirth: Secondary | ICD-10-CM | POA: Diagnosis present

## 2022-09-03 DIAGNOSIS — Z3A39 39 weeks gestation of pregnancy: Secondary | ICD-10-CM

## 2022-09-03 DIAGNOSIS — O99214 Obesity complicating childbirth: Secondary | ICD-10-CM

## 2022-09-03 DIAGNOSIS — Z8616 Personal history of COVID-19: Secondary | ICD-10-CM | POA: Diagnosis not present

## 2022-09-03 DIAGNOSIS — O34211 Maternal care for low transverse scar from previous cesarean delivery: Principal | ICD-10-CM | POA: Diagnosis present

## 2022-09-03 DIAGNOSIS — Z98891 History of uterine scar from previous surgery: Principal | ICD-10-CM

## 2022-09-03 LAB — CREATININE, SERUM
Creatinine, Ser: 0.53 mg/dL (ref 0.44–1.00)
GFR, Estimated: >60 mL/min (ref >60)

## 2022-09-03 LAB — ABO/RH: ABO/RH(D): B POS

## 2022-09-03 SURGERY — Surgical Case
Anesthesia: Spinal

## 2022-09-03 MED ORDER — STERILE WATER FOR IRRIGATION IR SOLN
Status: DC | PRN
  Administered 2022-09-03: 1000 mL

## 2022-09-03 MED ORDER — BUPIVACAINE IN DEXTROSE 0.75-8.25 % IT SOLN
INTRATHECAL | Status: DC | PRN
  Administered 2022-09-03: 1.6 mL via INTRATHECAL

## 2022-09-03 MED ORDER — ONDANSETRON HCL 4 MG/2ML IJ SOLN
INTRAMUSCULAR | Status: AC
  Filled 2022-09-03: qty 2

## 2022-09-03 MED ORDER — IBUPROFEN 600 MG PO TABS
600.0000 mg | ORAL_TABLET | Freq: Four times a day (QID) | ORAL | Status: DC
  Administered 2022-09-04 – 2022-09-05 (×5): 600 mg via ORAL
  Filled 2022-09-03 (×5): qty 1

## 2022-09-03 MED ORDER — FENTANYL CITRATE (PF) 100 MCG/2ML IJ SOLN
INTRAMUSCULAR | Status: DC | PRN
  Administered 2022-09-03: 15 ug via INTRATHECAL

## 2022-09-03 MED ORDER — ACETAMINOPHEN 500 MG PO TABS
1000.0000 mg | ORAL_TABLET | Freq: Four times a day (QID) | ORAL | Status: DC
  Administered 2022-09-03 – 2022-09-05 (×8): 1000 mg via ORAL
  Filled 2022-09-03 (×9): qty 2

## 2022-09-03 MED ORDER — DIPHENHYDRAMINE HCL 25 MG PO CAPS: 25.0000 mg | ORAL_CAPSULE | ORAL | Status: DC | PRN

## 2022-09-03 MED ORDER — EPHEDRINE 5 MG/ML INJ
INTRAVENOUS | Status: AC
  Filled 2022-09-03: qty 5

## 2022-09-03 MED ORDER — DEXAMETHASONE SODIUM PHOSPHATE 4 MG/ML IJ SOLN
INTRAMUSCULAR | Status: AC
  Filled 2022-09-03: qty 1

## 2022-09-03 MED ORDER — MONTELUKAST SODIUM 10 MG PO TABS
10.0000 mg | ORAL_TABLET | Freq: Every day | ORAL | Status: DC
  Administered 2022-09-03 – 2022-09-04 (×2): 10 mg via ORAL
  Filled 2022-09-03 (×2): qty 1

## 2022-09-03 MED ORDER — TETANUS-DIPHTH-ACELL PERTUSSIS 5-2.5-18.5 LF-MCG/0.5 IM SUSY: 0.5000 mL | PREFILLED_SYRINGE | Freq: Once | INTRAMUSCULAR | Status: DC

## 2022-09-03 MED ORDER — NALOXONE HCL 0.4 MG/ML IJ SOLN: 0.4000 mg | INTRAMUSCULAR | Status: DC | PRN

## 2022-09-03 MED ORDER — OXYTOCIN-SODIUM CHLORIDE 30-0.9 UT/500ML-% IV SOLN
INTRAVENOUS | Status: DC | PRN
  Administered 2022-09-03: 200 mL via INTRAVENOUS

## 2022-09-03 MED ORDER — LACTATED RINGERS IV SOLN: INTRAVENOUS | Status: DC

## 2022-09-03 MED ORDER — ACETAMINOPHEN 10 MG/ML IV SOLN
INTRAVENOUS | Status: AC
  Filled 2022-09-03: qty 100

## 2022-09-03 MED ORDER — TRANEXAMIC ACID-NACL 1000-0.7 MG/100ML-% IV SOLN
INTRAVENOUS | Status: AC
  Filled 2022-09-03: qty 100

## 2022-09-03 MED ORDER — PANTOPRAZOLE SODIUM 40 MG PO TBEC
40.0000 mg | DELAYED_RELEASE_TABLET | Freq: Every day | ORAL | Status: DC
  Administered 2022-09-03 – 2022-09-05 (×3): 40 mg via ORAL
  Filled 2022-09-03 (×3): qty 1

## 2022-09-03 MED ORDER — ACETAMINOPHEN 10 MG/ML IV SOLN
INTRAVENOUS | Status: DC | PRN
  Administered 2022-09-03: 1000 mg via INTRAVENOUS

## 2022-09-03 MED ORDER — SENNOSIDES-DOCUSATE SODIUM 8.6-50 MG PO TABS
2.0000 | ORAL_TABLET | Freq: Every day | ORAL | Status: DC
  Administered 2022-09-04 – 2022-09-05 (×2): 2 via ORAL
  Filled 2022-09-03 (×2): qty 2

## 2022-09-03 MED ORDER — COCONUT OIL OIL: 1.0000 | TOPICAL_OIL | Status: DC | PRN

## 2022-09-03 MED ORDER — DIPHENHYDRAMINE HCL 25 MG PO CAPS: 25.0000 mg | ORAL_CAPSULE | Freq: Four times a day (QID) | ORAL | Status: DC | PRN

## 2022-09-03 MED ORDER — ONDANSETRON HCL 4 MG/2ML IJ SOLN
INTRAMUSCULAR | Status: DC | PRN
  Administered 2022-09-03: 4 mg via INTRAVENOUS

## 2022-09-03 MED ORDER — FENTANYL CITRATE (PF) 100 MCG/2ML IJ SOLN
INTRAMUSCULAR | Status: AC
  Filled 2022-09-03: qty 2

## 2022-09-03 MED ORDER — POVIDONE-IODINE 10 % EX SWAB
2.0000 | Freq: Once | CUTANEOUS | Status: AC
  Administered 2022-09-03: 2 via TOPICAL

## 2022-09-03 MED ORDER — NALOXONE HCL 4 MG/10ML IJ SOLN: 1.0000 ug/kg/h | INTRAVENOUS | Status: DC | PRN

## 2022-09-03 MED ORDER — KETOROLAC TROMETHAMINE 30 MG/ML IJ SOLN
INTRAMUSCULAR | Status: AC
  Filled 2022-09-03: qty 1

## 2022-09-03 MED ORDER — FLUTICASONE PROPIONATE 50 MCG/ACT NA SUSP
1.0000 | Freq: Two times a day (BID) | NASAL | Status: DC
  Administered 2022-09-03 – 2022-09-05 (×4): 1 via NASAL
  Filled 2022-09-03: qty 16

## 2022-09-03 MED ORDER — ENOXAPARIN SODIUM 60 MG/0.6ML IJ SOSY
60.0000 mg | PREFILLED_SYRINGE | INTRAMUSCULAR | Status: DC
  Administered 2022-09-04 – 2022-09-05 (×2): 60 mg via SUBCUTANEOUS
  Filled 2022-09-03 (×2): qty 0.6

## 2022-09-03 MED ORDER — ZOLPIDEM TARTRATE 5 MG PO TABS: 5.0000 mg | ORAL_TABLET | Freq: Every evening | ORAL | Status: DC | PRN

## 2022-09-03 MED ORDER — MEPERIDINE HCL 25 MG/ML IJ SOLN: 6.2500 mg | INTRAMUSCULAR | Status: DC | PRN

## 2022-09-03 MED ORDER — PHENYLEPHRINE HCL-NACL 20-0.9 MG/250ML-% IV SOLN
INTRAVENOUS | Status: DC | PRN
  Administered 2022-09-03: 60 ug/min via INTRAVENOUS

## 2022-09-03 MED ORDER — SCOPOLAMINE 1 MG/3DAYS TD PT72
MEDICATED_PATCH | TRANSDERMAL | Status: AC
  Filled 2022-09-03: qty 1

## 2022-09-03 MED ORDER — TRANEXAMIC ACID-NACL 1000-0.7 MG/100ML-% IV SOLN
INTRAVENOUS | Status: DC | PRN
  Administered 2022-09-03: 1000 mg via INTRAVENOUS

## 2022-09-03 MED ORDER — OXYTOCIN-SODIUM CHLORIDE 30-0.9 UT/500ML-% IV SOLN
INTRAVENOUS | Status: AC
  Filled 2022-09-03: qty 500

## 2022-09-03 MED ORDER — MORPHINE SULFATE (PF) 0.5 MG/ML IJ SOLN
INTRAMUSCULAR | Status: AC
  Filled 2022-09-03: qty 10

## 2022-09-03 MED ORDER — KETOROLAC TROMETHAMINE 30 MG/ML IJ SOLN
30.0000 mg | Freq: Four times a day (QID) | INTRAMUSCULAR | Status: AC
  Administered 2022-09-03 – 2022-09-04 (×3): 30 mg via INTRAVENOUS
  Filled 2022-09-03 (×3): qty 1

## 2022-09-03 MED ORDER — DIPHENHYDRAMINE HCL 50 MG/ML IJ SOLN: 12.5000 mg | INTRAMUSCULAR | Status: DC | PRN

## 2022-09-03 MED ORDER — SCOPOLAMINE 1 MG/3DAYS TD PT72
1.0000 | MEDICATED_PATCH | Freq: Once | TRANSDERMAL | Status: DC
  Administered 2022-09-03: 1.5 mg via TRANSDERMAL

## 2022-09-03 MED ORDER — EPHEDRINE SULFATE-NACL 50-0.9 MG/10ML-% IV SOSY
PREFILLED_SYRINGE | INTRAVENOUS | Status: DC | PRN
  Administered 2022-09-03: 5 mg via INTRAVENOUS

## 2022-09-03 MED ORDER — OXYCODONE HCL 5 MG PO TABS
5.0000 mg | ORAL_TABLET | ORAL | Status: DC | PRN
  Administered 2022-09-04 (×3): 5 mg via ORAL
  Filled 2022-09-03 (×3): qty 1

## 2022-09-03 MED ORDER — SODIUM CHLORIDE 0.9% FLUSH: 3.0000 mL | INTRAVENOUS | Status: DC | PRN

## 2022-09-03 MED ORDER — KETOROLAC TROMETHAMINE 30 MG/ML IJ SOLN
30.0000 mg | Freq: Once | INTRAMUSCULAR | Status: AC | PRN
  Administered 2022-09-03: 30 mg via INTRAVENOUS

## 2022-09-03 MED ORDER — SODIUM CHLORIDE 0.9 % IR SOLN
Status: DC | PRN
  Administered 2022-09-03: 1

## 2022-09-03 MED ORDER — LORATADINE 10 MG PO TABS
10.0000 mg | ORAL_TABLET | Freq: Every day | ORAL | Status: DC
  Administered 2022-09-03 – 2022-09-04 (×2): 10 mg via ORAL
  Filled 2022-09-03 (×2): qty 1

## 2022-09-03 MED ORDER — PHENYLEPHRINE HCL (PRESSORS) 10 MG/ML IV SOLN
INTRAVENOUS | Status: DC | PRN
  Administered 2022-09-03: 160 ug via INTRAVENOUS
  Administered 2022-09-03: 80 ug via INTRAVENOUS
  Administered 2022-09-03: 160 ug via INTRAVENOUS
  Administered 2022-09-03: 80 ug via INTRAVENOUS

## 2022-09-03 MED ORDER — MORPHINE SULFATE (PF) 0.5 MG/ML IJ SOLN
INTRAMUSCULAR | Status: DC | PRN
  Administered 2022-09-03: 150 ug via INTRATHECAL

## 2022-09-03 MED ORDER — DIBUCAINE (PERIANAL) 1 % EX OINT: 1.0000 | TOPICAL_OINTMENT | CUTANEOUS | Status: DC | PRN

## 2022-09-03 MED ORDER — PRENATAL MULTIVITAMIN CH
1.0000 | ORAL_TABLET | Freq: Every day | ORAL | Status: DC
  Administered 2022-09-03 – 2022-09-05 (×3): 1 via ORAL
  Filled 2022-09-03 (×3): qty 1

## 2022-09-03 MED ORDER — SIMETHICONE 80 MG PO CHEW
80.0000 mg | CHEWABLE_TABLET | Freq: Three times a day (TID) | ORAL | Status: DC
  Administered 2022-09-03 – 2022-09-05 (×6): 80 mg via ORAL
  Filled 2022-09-03 (×6): qty 1

## 2022-09-03 MED ORDER — INFLUENZA VAC SPLIT QUAD 0.5 ML IM SUSY
0.5000 mL | PREFILLED_SYRINGE | INTRAMUSCULAR | Status: DC
  Filled 2022-09-03: qty 0.5

## 2022-09-03 MED ORDER — WITCH HAZEL-GLYCERIN EX PADS: 1.0000 | MEDICATED_PAD | CUTANEOUS | Status: DC | PRN

## 2022-09-03 MED ORDER — OXYTOCIN-SODIUM CHLORIDE 30-0.9 UT/500ML-% IV SOLN: 2.5000 [IU]/h | INTRAVENOUS | Status: AC

## 2022-09-03 MED ORDER — MENTHOL 3 MG MT LOZG: 1.0000 | LOZENGE | OROMUCOSAL | Status: DC | PRN

## 2022-09-03 MED ORDER — METHYLERGONOVINE MALEATE 0.2 MG/ML IJ SOLN
INTRAMUSCULAR | Status: DC | PRN
  Administered 2022-09-03: .2 mg via INTRAMUSCULAR

## 2022-09-03 MED ORDER — ONDANSETRON HCL 4 MG/2ML IJ SOLN
4.0000 mg | Freq: Three times a day (TID) | INTRAMUSCULAR | Status: DC | PRN
  Administered 2022-09-04: 4 mg via INTRAVENOUS
  Filled 2022-09-03: qty 2

## 2022-09-03 MED ORDER — SIMETHICONE 80 MG PO CHEW
80.0000 mg | CHEWABLE_TABLET | ORAL | Status: DC | PRN
  Administered 2022-09-04: 80 mg via ORAL
  Filled 2022-09-03: qty 1

## 2022-09-03 SURGICAL SUPPLY — 35 items
CHLORAPREP W/TINT 26ML (MISCELLANEOUS) ×2 IMPLANT
CLAMP CORD UMBIL (MISCELLANEOUS) ×1 IMPLANT
CLOTH BEACON ORANGE TIMEOUT ST (SAFETY) ×1 IMPLANT
DERMABOND ADVANCED .7 DNX12 (GAUZE/BANDAGES/DRESSINGS) ×1 IMPLANT
DRSG OPSITE POSTOP 4X10 (GAUZE/BANDAGES/DRESSINGS) ×1 IMPLANT
ELECT REM PT RETURN 9FT ADLT (ELECTROSURGICAL) ×1
ELECTRODE REM PT RTRN 9FT ADLT (ELECTROSURGICAL) ×1 IMPLANT
EXTRACTOR VACUUM BELL STYLE (SUCTIONS) IMPLANT
GAUZE SPONGE 4X4 12PLY STRL LF (GAUZE/BANDAGES/DRESSINGS) IMPLANT
GLOVE BIO SURGEON STRL SZ 6.5 (GLOVE) ×1 IMPLANT
GLOVE BIOGEL PI IND STRL 6.5 (GLOVE) ×1 IMPLANT
GLOVE BIOGEL PI IND STRL 7.0 (GLOVE) ×2 IMPLANT
GOWN STRL REUS W/TWL LRG LVL3 (GOWN DISPOSABLE) ×2 IMPLANT
HEMOSTAT ARISTA ABSORB 3G PWDR (HEMOSTASIS) IMPLANT
KIT ABG SYR 3ML LUER SLIP (SYRINGE) IMPLANT
NDL HYPO 25X5/8 SAFETYGLIDE (NEEDLE) IMPLANT
NEEDLE HYPO 25X5/8 SAFETYGLIDE (NEEDLE) IMPLANT
NS IRRIG 1000ML POUR BTL (IV SOLUTION) ×1 IMPLANT
PACK C SECTION WH (CUSTOM PROCEDURE TRAY) ×1 IMPLANT
PAD ABD 8X10 STRL (GAUZE/BANDAGES/DRESSINGS) IMPLANT
PAD OB MATERNITY 4.3X12.25 (PERSONAL CARE ITEMS) ×1 IMPLANT
RTRCTR C-SECT PINK 25CM LRG (MISCELLANEOUS) IMPLANT
SUT PDS AB 0 CTX 36 PDP370T (SUTURE) IMPLANT
SUT PLAIN 2 0 (SUTURE)
SUT PLAIN 2 0 XLH (SUTURE) IMPLANT
SUT PLAIN ABS 2-0 CT1 27XMFL (SUTURE) IMPLANT
SUT VIC AB 0 CT1 36 (SUTURE) ×2 IMPLANT
SUT VIC AB 2-0 CT1 27 (SUTURE) ×1
SUT VIC AB 2-0 CT1 TAPERPNT 27 (SUTURE) ×1 IMPLANT
SUT VIC AB 3-0 SH 27 (SUTURE)
SUT VIC AB 3-0 SH 27X BRD (SUTURE) IMPLANT
SUT VIC AB 4-0 KS 27 (SUTURE) ×1 IMPLANT
TOWEL OR 17X24 6PK STRL BLUE (TOWEL DISPOSABLE) ×1 IMPLANT
TRAY FOLEY W/BAG SLVR 14FR LF (SET/KITS/TRAYS/PACK) ×1 IMPLANT
WATER STERILE IRR 1000ML POUR (IV SOLUTION) ×1 IMPLANT

## 2022-09-03 NOTE — Op Note (Addendum)
C-Section Operative Note  Date: 09/03/22  Preoperative Diagnosis: IUP @ 39 1/7, BMI 42, asthma on medications Postoperative Diagnosis: same as above Procedure: ERLTCS Indication: history of LTCS x2 Surgeon: Ellison Hughs, MD Assistant: Sheppard Evens, MD. Assistance was required for exposure given patient habitus Findings: Viable female infant weighing 7lb2oz with APGARS of 9 and 9 at 1 and 5 minutes, respectively. Normal appearing uterus but with very thin LUS (approx 3-46mm, no window appreciated), bilateral fallopian tubes and ovaries. No intra-uterine adhesions noted, minimal fascial scar tissue Specimens: placenta to L&D EBL 215cc IVF 2100cc UOP 500cc  Patient Course: Patient admitted for scheduled ERLTCS given history of LTCS x2, declining BTL.   Consent:  R/B/A of cesarean section discussed with patient. Alternative would be vaginal delivery which would mean shorter postpartum stay and decreased risk of bleeding. Risks of section include infection of the uterus, pelvic organs, or skin, inadvertent injury to internal organs, such as bowel or bladder. If there is major injury, extensive surgery may be required. If injury is minor, it may be treated with relative ease. Discussed possibility of excessive blood loss and transfusion. If bleeding cannot be controlled using medical or minor surgical methods, a cesarean hysterectomy may be performed which would mean no future fertility. Patient accepts the possibility of blood transfusion, if necessary. Patient understands and agrees to move forward with section.   Operative Procedure: Patient was taken to the operating room where epidural anesthesia was found to be adequate by Allis clamp test. She was prepped and draped in the normal sterile fashion in the dorsal supine position with a leftward tilt. An appropriate time out was performed. A Pfannenstiel skin incision was then made with the scalpel and carried through to the underlying layer of  fascia by sharp dissection and Bovie cautery. The fascia was nicked in the midline and the incision was extended laterally with Mayo scissors. The superior aspect of the incision was grasped Coker clamps and dissected off the underlying rectus muscles. In a similar fashion the inferior aspect was dissected off the rectus muscles. Rectus muscles were separated in the midline and the peritoneal cavity entered bluntly. The peritoneal incision was then extended both superiorly and inferiorly with careful attention to avoid both bowel and bladder. The Alexis self-retaining wound retractor was then placed within the incision and the lower uterine segment exposed. The bladder flap was developed with Metzenbaum scissors and pushed away from the lower uterine segment. The lower uterine segment was then incised in a low transverse fashion and the cavity itself entered bluntly. The incision was extended bluntly. Amniotic sac was ruptured and fluid was noted to be clear and quite copious in color. The infant's head was then lifted and delivered from the incision without difficulty using the standard movements. Loose nuchal x1 reduced. The remainder of the infant delivered and the nose and mouth bulb suctioned with the cord clamped and cut as well. The infant was handed off to NICU. The placenta was then spontaneously expressed from the uterus and the uterus cleared of all clots and debris with moist lap sponge. The uterine incision was then repaired in 2 layers the first layer was a running locked layer of 0-vicryl and the second an imbricating layer of the same suture. Initially, intermittent atony noted at fundus. Given amount of fluid noted, IM methergine and TXA administered. The tubes and ovaries were inspected and the gutters cleared of all clots and debris. The uterine incision was inspected and found to be hemostatic. All  instruments and sponges as well as the Alexis retractor were then removed from the abdomen.  The  fascia was then closed with 0 PDS in a running fashion from bilateral apices and tied at midline given patient habitus. Subcutaneous tissue was reapproximated with 3-0 plain in a running fashion. The skin was closed with a subcuticular stitch of 4-0 Vicryl on a Keith needle and then reinforced with Dermabond and a Honeycomb. At the conclusion of the procedure all instruments and sponge counts were correct. Patient was taken to the recovery room in good condition with her baby accompanying her skin to skin.

## 2022-09-03 NOTE — Anesthesia Procedure Notes (Signed)
Spinal  Patient location during procedure: OR Start time: 09/03/2022 10:18 AM End time: 09/03/2022 10:20 AM Staffing Performed: anesthesiologist  Anesthesiologist: Atilano Median, DO Performed by: Atilano Median, DO Authorized by: Atilano Median, DO   Preanesthetic Checklist Completed: patient identified, IV checked, site marked, risks and benefits discussed, surgical consent, monitors and equipment checked, pre-op evaluation and timeout performed Spinal Block Patient position: sitting Prep: DuraPrep Patient monitoring: heart rate, cardiac monitor, continuous pulse ox and blood pressure Approach: midline Location: L3-4 Injection technique: single-shot Needle Needle type: Pencan  Needle gauge: 24 G Needle length: 10 cm Assessment Events: CSF return Additional Notes Patient identified. Risks/Benefits/Options discussed with patient including but not limited to bleeding, infection, nerve damage, paralysis, failed block, incomplete pain control, headache, blood pressure changes, nausea, vomiting, reactions to medications, itching and postpartum back pain. Confirmed with bedside nurse the patient's most recent platelet count. Confirmed with patient that they are not currently taking any anticoagulation, have any bleeding history or any family history of bleeding disorders. Patient expressed understanding and wished to proceed. All questions were answered. Sterile technique was used throughout the entire procedure. Please see nursing notes for vital signs. Warning signs of high block given to the patient including shortness of breath, tingling/numbness in hands, complete motor block, or any concerning symptoms with instructions to call for help. Patient was given instructions on fall risk and not to get out of bed. All questions and concerns addressed with instructions to call with any issues or inadequate analgesia.

## 2022-09-03 NOTE — Interval H&P Note (Signed)
History and Physical Interval Note:  09/03/2022 8:25 AM  Christine Wilkerson  has presented today for surgery, with the diagnosis of repeat c-section.  The various methods of treatment have been discussed with the patient and family. After consideration of risks, benefits and other options for treatment, the patient has consented to  Procedure(s): CESAREAN SECTION (N/A) as a surgical intervention.  The patient's history has been reviewed, patient examined, no change in status, stable for surgery.  I have reviewed the patient's chart and labs.  Questions were answered to the patient's satisfaction.  Declining BTL   Quention Mcneill M Paxson Harrower

## 2022-09-03 NOTE — Anesthesia Preprocedure Evaluation (Signed)
Anesthesia Evaluation  Patient identified by MRN, date of birth, ID band Patient awake    Reviewed: Allergy & Precautions, NPO status , Patient's Chart, lab work & pertinent test results  Airway Mallampati: II  TM Distance: >3 FB Neck ROM: Full    Dental no notable dental hx.    Pulmonary asthma ,    Pulmonary exam normal        Cardiovascular negative cardio ROS   Rhythm:Regular Rate:Normal     Neuro/Psych  Headaches, negative psych ROS   GI/Hepatic Neg liver ROS, GERD  Medicated,  Endo/Other  negative endocrine ROS  Renal/GU negative Renal ROS  negative genitourinary   Musculoskeletal   Abdominal Normal abdominal exam  (+)   Peds  Hematology negative hematology ROS (+)   Anesthesia Other Findings   Reproductive/Obstetrics (+) Pregnancy                             Anesthesia Physical Anesthesia Plan  ASA: 2  Anesthesia Plan: Spinal   Post-op Pain Management:    Induction:   PONV Risk Score and Plan: 2 and Treatment may vary due to age or medical condition and Ondansetron  Airway Management Planned: Simple Face Mask, Natural Airway and Nasal Cannula  Additional Equipment: None  Intra-op Plan:   Post-operative Plan:   Informed Consent: I have reviewed the patients History and Physical, chart, labs and discussed the procedure including the risks, benefits and alternatives for the proposed anesthesia with the patient or authorized representative who has indicated his/her understanding and acceptance.     Dental advisory given  Plan Discussed with: CRNA  Anesthesia Plan Comments: (Lab Results      Component                Value               Date                      WBC                      7.4                 09/01/2022                HGB                      13.4                09/01/2022                HCT                      41.6                09/01/2022                 MCV                      86.3                09/01/2022                PLT                      187  09/01/2022           Lab Results      Component                Value               Date                      NA                       139                 08/30/2022                K                        3.8                 08/30/2022                CO2                      21 (L)              08/30/2022                GLUCOSE                  95                  08/30/2022                BUN                      5 (L)               08/30/2022                CREATININE               0.58                08/30/2022                CALCIUM                  8.8 (L)             08/30/2022                GFRNONAA                 >60                 08/30/2022          )        Anesthesia Quick Evaluation

## 2022-09-03 NOTE — Transfer of Care (Signed)
Immediate Anesthesia Transfer of Care Note  Patient: Christine Wilkerson  Procedure(s) Performed: CESAREAN SECTION  Patient Location: PACU  Anesthesia Type:Spinal  Level of Consciousness: awake  Airway & Oxygen Therapy: Patient Spontanous Breathing  Post-op Assessment: Report given to RN  Post vital signs: Reviewed  Last Vitals:  Vitals Value Taken Time  BP 99/58 09/03/22 1253  Temp 36.6 C 09/03/22 1253  Pulse 76 09/03/22 1253  Resp 20 09/03/22 1253  SpO2 99 % 09/03/22 1253    Last Pain:  Vitals:   09/03/22 1339  TempSrc:   PainSc: 2       Patients Stated Pain Goal: 5 (09/03/22 1138)  Complications: No notable events documented.

## 2022-09-03 NOTE — Lactation Note (Addendum)
This note was copied from a baby's chart. Lactation Consultation Note  Patient Name: Christine Wilkerson HQION'G Date: 09/03/2022 Reason for consult: Initial assessment;Term Age:29 hours, P4, C/ S delivery see Birth Parent -MR  See Birth Parent past BF experience see maternal data below. Infant's previous feedings are 30 to 40 minutes in length see flow sheet. Per Birth Parent, she has DEBP at home but forgot brand name. Birth Parent attempted latch infant on her left breast using the cross cradle hold, infant briefly latched for 2 minutes then fell asleep at the breast. Per Birth Parent, infant had 2 episodes of emesis since birth and had been gaging   and spitty, she has been doing lots of STS with infant.   Birth Parent was doing STS with infant, knows how to hand expess, LC reviewed with breast model. Birth Parent will continue to BF infant according to hunger cues, on demand, 8 to 12+ times within 24 hrs, STS. Birth Parent knows to call RN/LC on MBU for further latch assistance if needed.  Birth Parent was  made aware of O/P services, breastfeeding support groups, community resources, and our phone # for post-discharge questions.   Maternal Data Has patient been taught Hand Expression?: Yes Does the patient have breastfeeding experience prior to this delivery?: Yes How long did the patient breastfeed?: Per Birth Parent, 1st child did not latch and briefly BF 2nd and 3rd child for 2 to 73months  Feeding Mother's Current Feeding Choice: Breast Milk  LATCH Score Latch: Too sleepy or reluctant, no latch achieved, no sucking elicited.  Audible Swallowing: None  Type of Nipple: Everted at rest and after stimulation  Comfort (Breast/Nipple): Soft / non-tender  Hold (Positioning): Assistance needed to correctly position infant at breast and maintain latch.  LATCH Score: 5   Lactation Tools Discussed/Used    Interventions Interventions: Breast feeding basics reviewed;Assisted with  latch;Skin to skin;Breast compression;Adjust position;Support pillows;Position options;Hand express;Education;LC Services brochure  Discharge    Consult Status Consult Status: Follow-up Date: 09/04/22 Follow-up type: In-patient    Danelle Earthly 09/03/2022, 10:52 PM

## 2022-09-03 NOTE — Anesthesia Postprocedure Evaluation (Signed)
Anesthesia Post Note  Patient: Dia Donate Ditmars  Procedure(s) Performed: CESAREAN SECTION     Patient location during evaluation: PACU Anesthesia Type: Spinal Level of consciousness: oriented and awake and alert Pain management: pain level controlled Vital Signs Assessment: post-procedure vital signs reviewed and stable Respiratory status: spontaneous breathing, respiratory function stable and patient connected to nasal cannula oxygen Cardiovascular status: blood pressure returned to baseline and stable Postop Assessment: no headache, no backache and no apparent nausea or vomiting Anesthetic complications: no   No notable events documented.  Last Vitals:  Vitals:   09/03/22 1230 09/03/22 1253  BP: (!) 97/56 (!) 99/58  Pulse: 65 76  Resp: (!) 22 20  Temp:  36.6 C  SpO2: 95% 99%    Last Pain:  Vitals:   09/03/22 1339  TempSrc:   PainSc: 2                  Nelle Don Valla Pacey

## 2022-09-04 LAB — CBC
HCT: 34.4 % — ABNORMAL LOW (ref 36.0–46.0)
Hemoglobin: 11.4 g/dL — ABNORMAL LOW (ref 12.0–15.0)
MCH: 27.9 pg (ref 26.0–34.0)
MCHC: 33.1 g/dL (ref 30.0–36.0)
MCV: 84.3 fL (ref 80.0–100.0)
Platelets: 162 10*3/uL (ref 150–400)
RBC: 4.08 MIL/uL (ref 3.87–5.11)
RDW: 13.4 % (ref 11.5–15.5)
WBC: 10.2 10*3/uL (ref 4.0–10.5)
nRBC: 0 % (ref 0.0–0.2)

## 2022-09-04 NOTE — Progress Notes (Signed)
Patient is eating, ambulating, has not yet voided.  Pain control is good.  +flatus.  No complaints. Appropriate lochia.  Vitals:   09/03/22 2100 09/03/22 2334 09/04/22 0344 09/04/22 0625  BP: (!) 88/52 (!) 95/44 (!) 89/45 (!) 89/44  Pulse: 65 65 64 66  Resp: 18 18 18 18   Temp: 98.3 F (36.8 C) 98.5 F (36.9 C) 98.6 F (37 C) 98.4 F (36.9 C)  TempSrc: Oral Oral Oral Oral  SpO2: 99% 97% 98% 96%  Weight:      Height:        Fundus firm Inc: c/d/I Ext: no calf tenderness  Lab Results  Component Value Date   WBC 10.2 09/04/2022   HGB 11.4 (L) 09/04/2022   HCT 34.4 (L) 09/04/2022   MCV 84.3 09/04/2022   PLT 162 09/04/2022    --/--/B POS Performed at [facility], [address], [place], [place]  (09/14 0500)  A/P Post op day #1 s/p repeat c/s. Doing well Circ desired and performed.  Routine care.  Expect d/c 9/16.    10/16

## 2022-09-05 MED ORDER — SENNOSIDES-DOCUSATE SODIUM 8.6-50 MG PO TABS: 2.0000 | ORAL_TABLET | Freq: Every day | ORAL | 1 refills | Status: AC

## 2022-09-05 MED ORDER — IBUPROFEN 600 MG PO TABS: 600.0000 mg | ORAL_TABLET | Freq: Four times a day (QID) | ORAL | 1 refills | Status: AC | PRN

## 2022-09-05 MED ORDER — OXYCODONE-ACETAMINOPHEN 5-325 MG PO TABS: 1.0000 | ORAL_TABLET | ORAL | 0 refills | Status: AC | PRN

## 2022-09-05 NOTE — Discharge Instructions (Signed)
Call office with any concerns [phone] 

## 2022-09-05 NOTE — Lactation Note (Signed)
This note was copied from a baby's chart. Lactation Consultation Note  Patient Name: Christine Wilkerson WNUUV'O Date: 09/05/2022 Reason for consult: Follow-up assessment;Infant weight loss;Term (8 % weight loss,baby latched with depth on the right breast, swallows noted.) Age:29 hours Per Birth parent , breast feeding and latching is going so much better than my 1st 2 babies. Per mom milk feels like its coming in.  Raoul reviewed BF D/C teaching , importance of feeding STS until the baby is back to birth weight, gaining well and staying awake for majority of the feeding.  Feed with feeding cues  ( 8-12 times in 24 hours).  Maternal Data    Feeding Mother's Current Feeding Choice: Breast Milk  LATCH Score Latch:  (latched with depth)  Audible Swallowing:  (multiple swallows,)     Comfort (Breast/Nipple):  (mom comfortable)  Hold (Positioning):  (mom latched the baby)        Discharge per mom has a DEBP at home.     Consult Status Consult Status: Complete Date: 09/05/22    Myer Haff 09/05/2022, 12:50 PM

## 2022-09-05 NOTE — Lactation Note (Signed)
This note was copied from a baby's chart. Lactation Consultation Note  Patient Name: Boy Lanijah Warzecha [id number]'Z Date: 09/05/2022   Age:29 hours Per RN Shirlee Limerick) , Birth parent doesn't want to be seen by [facility] services tonight.  Maternal Data    Feeding    LATCH Score                    Lactation Tools Discussed/Used    Interventions    Discharge    Consult Status      Vicente Serene 09/05/2022, 1:30 AM

## 2022-09-05 NOTE — Progress Notes (Signed)
Subjective: Postpartum Day 2: Cesarean Delivery Patient reports tolerating PO, + flatus, and no problems voiding.  She reports no BM yet but does not feel constipated. Lochia mild, pain well controlled, bonding well with baby - bottlefeeding. She requests discharge home today   Objective: Vital signs in last 24 hours: Temp:  [98 F (36.7 C)-98.4 F (36.9 C)] 98.4 F (36.9 C) (09/16 0533) Pulse Rate:  [69-90] 90 (09/16 0533) Resp:  [17-18] 17 (09/16 0533) BP: (105-106)/(58-59) 106/58 (09/16 0533) SpO2:  [98 %] 98 % (09/16 0533)  Physical Exam:  General: alert Lochia: appropriate Uterine Fundus: firm Incision: no significant drainage DVT Evaluation: No evidence of DVT seen on physical exam.  Recent Labs    09/04/22 0451  HGB 11.4*  HCT 34.4*    Assessment/Plan: Status post Cesarean section. Doing well postoperatively.  Discharge home with standard precautions and return to clinic in 2weeks for incision check and 6 weeks for postpartum visit   [place] W Darrel Baroni, DO 09/05/2022, 12:12 PM

## 2022-09-05 NOTE — Discharge Summary (Signed)
Postpartum Discharge Summary  Date of Service updated      Patient Name: Christine Wilkerson [facility] DOB: 11-28-1991 MRN: [id number]  Date of admission: 09/03/2022 Delivery date:09/03/2022  Delivering provider: Carlisle Cater  Date of discharge: 09/05/2022  Admitting diagnosis: History of cesarean section [Z98.891] Intrauterine pregnancy: [redacted]w[redacted]d     Secondary diagnosis:  Principal Problem:   History of cesarean section  Additional problems: none    Discharge diagnosis: Term Pregnancy Delivered                                              Post partum procedures: n/a Augmentation: N/A Complications: None  Hospital course: Sceduled C/S   31 y.o. yo [id number] at [redacted]w[redacted]d was admitted to the hospital 09/03/2022 for scheduled cesarean section with the following indication:Elective Repeat.Delivery details are as follows:  Membrane Rupture Time/Date:  ,   Delivery Method:C-Section, Low Transverse  Details of operation can be found in separate operative note.  Patient had an uncomplicated postpartum course.  She is ambulating, tolerating a regular diet, passing flatus, and urinating well. Patient is discharged home in stable condition on  09/05/22        Newborn Data: Birth date:09/03/2022  Birth time:10:48 AM  Gender:Female  Living status:Living  Apgars:9 ,9  Weight:3240 g     Magnesium Sulfate received: No BMZ received: No  Physical exam  Vitals:   09/04/22 0344 09/04/22 0625 09/04/22 2233 09/05/22 0533  BP: (!) 89/45 (!) 89/44 (!) 105/59 (!) 106/58  Pulse: 64 66 69 90  Resp: 18 18 18 17   Temp: 98.6 F (37 C) 98.4 F (36.9 C) 98 F (36.7 C) 98.4 F (36.9 C)  TempSrc: Oral Oral Oral Oral  SpO2: 98% 96% 98% 98%  Weight:      Height:       General: alert, cooperative, and no distress Lochia: appropriate Uterine Fundus: firm Incision: Dressing is clean, dry, and intact DVT Evaluation: No evidence of DVT seen on physical exam. Labs: Lab Results  Component Value Date   WBC 10.2  09/04/2022   HGB 11.4 (L) 09/04/2022   HCT 34.4 (L) 09/04/2022   MCV 84.3 09/04/2022   PLT 162 09/04/2022      Latest Ref Rng & Units 09/03/2022    1:34 PM  CMP  Creatinine 0.44 - 1.00 mg/dL 09/05/2022    Edinburgh Score:    09/04/2022    6:27 PM  Edinburgh Postnatal Depression Scale Screening Tool  I have been able to laugh and see the funny side of things. 0  I have looked forward with enjoyment to things. 1  I have blamed myself unnecessarily when things went wrong. 1  I have been anxious or worried for no good reason. 1  I have felt scared or panicky for no good reason. 1  Things have been getting on top of me. 2  I have been so unhappy that I have had difficulty sleeping. 1  I have felt sad or miserable. 1  I have been so unhappy that I have been crying. 1  The thought of harming myself has occurred to me. 0  Edinburgh Postnatal Depression Scale Total 9      After visit meds:  Allergies as of 09/05/2022       Reactions   Amoxicillin Rash   Topamax [topiramate] Other (See Comments)   "  feeling tired, not myself"        Medication List     STOP taking these medications    cyclobenzaprine 5 MG tablet Commonly known as: FLEXERIL       TAKE these medications    albuterol (2.5 MG/3ML) 0.083% nebulizer solution Commonly known as: PROVENTIL Use 1 unit dose every 4 hours as needed for coughing or wheezing spells   albuterol 108 (90 Base) MCG/ACT inhaler Commonly known as: Ventolin HFA 2 PUFFS EVERY 4 HOURS AS NEEDED FOR COUGHING OR WHEEZING SPELLS   Cetirizine HCl 10 MG Caps Take 10 mg by mouth daily.   fluticasone 50 MCG/ACT nasal spray Commonly known as: FLONASE Place 1 spray into both nostrils 2 (two) times daily.   ibuprofen 600 MG tablet Commonly known as: ADVIL Take 1 tablet (600 mg total) by mouth every 6 (six) hours as needed for moderate pain or cramping.   montelukast 10 MG tablet Commonly known as: SINGULAIR TAKE 1 TABLET BY MOUTH EVERYDAY AT  BEDTIME   omeprazole 20 MG capsule Commonly known as: PRILOSEC TAKE 1 CAPSULE BY MOUTH TWICE A DAY FOR REFLUX   ondansetron 4 MG disintegrating tablet Commonly known as: ZOFRAN-ODT Take 4 mg by mouth daily as needed for nausea/vomiting.   PRENATAL AD PO Take 1 tablet by mouth daily.   senna-docusate 8.6-50 MG tablet Commonly known as: Senokot-S Take 2 tablets by mouth daily. Start taking on: September 06, 2022         Discharge home in stable condition Infant Feeding: Breast Infant Disposition:home with mother Discharge instruction: per After Visit Summary and Postpartum booklet. Activity: Advance as tolerated. Pelvic rest for 6 weeks.  Diet: routine diet Anticipated Birth Control: Unsure Postpartum Appointment:6 weeks Additional Postpartum F/U: Postpartum Depression checkup Future Appointments:No future appointments. Follow up Visit:  [place]. Schedule an appointment as soon as possible for a visit.   Why: 2 weeks for incision check and 6 weeks for postpartum visit Contact information: [address]  [address] [place] [address] [phone]                     09/05/2022 Isaiah Serge, DO

## 2022-09-14 ENCOUNTER — Telehealth (HOSPITAL_COMMUNITY): Payer: Self-pay | Admitting: *Deleted

## 2022-09-14 NOTE — Telephone Encounter (Signed)
Hospital Discharge Follow-Up Call:  Patient reports that she is well and has no concerns about her healing process.  EPDS today was 3 and she endorses this accurately reflects that she is doing well emotionally.  Patient says that baby is well and she has no concerns about baby's health.  She reports that baby sleeps in a bassinet.  Reviewed ABCs of Safe Sleep.
